# Patient Record
Sex: Female | Born: 2000 | Race: Black or African American | Hispanic: No | Marital: Single | Smoking: Former smoker
Health system: Southern US, Community
[De-identification: ages and names within clinical notes are randomized; demographics above are authoritative.]

## PROBLEM LIST (undated history)

## (undated) ENCOUNTER — Ambulatory Visit (HOSPITAL_COMMUNITY): Admission: EM | Payer: No Typology Code available for payment source | Source: Home / Self Care

## (undated) DIAGNOSIS — N390 Urinary tract infection, site not specified: Secondary | ICD-10-CM

## (undated) DIAGNOSIS — K2970 Gastritis, unspecified, without bleeding: Secondary | ICD-10-CM

## (undated) DIAGNOSIS — F32A Depression, unspecified: Secondary | ICD-10-CM

## (undated) HISTORY — PX: NO PAST SURGERIES: SHX2092

## (undated) HISTORY — PX: WISDOM TOOTH EXTRACTION: SHX21

---
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
## 2022-02-08 ENCOUNTER — Other Ambulatory Visit: Payer: Self-pay | Admitting: Emergency Medicine

## 2022-02-08 ENCOUNTER — Telehealth: Payer: Self-pay | Admitting: Emergency Medicine

## 2022-02-08 DIAGNOSIS — B379 Candidiasis, unspecified: Secondary | ICD-10-CM

## 2022-02-08 MED ORDER — FLUCONAZOLE 150 MG PO TABS: 150.0000 mg | ORAL_TABLET | Freq: Once | ORAL | 0 refills | Status: AC

## 2022-02-08 NOTE — Telephone Encounter (Signed)
Patient called requesting rx for diflucan due to yeast infection symptoms.  Rx sent to pharmacy on file per protocol.   Phone call to patient to inform of prescription being sent to pharmacy, unable to leave message.

## 2022-02-09 ENCOUNTER — Other Ambulatory Visit: Payer: Self-pay

## 2022-02-09 ENCOUNTER — Ambulatory Visit (HOSPITAL_COMMUNITY)
Admission: EM | Admit: 2022-02-09 | Discharge: 2022-02-09 | Disposition: A | Discharge status: Home / Self Care | Payer: Medicaid Other | Attending: Internal Medicine | Admitting: Internal Medicine

## 2022-02-09 ENCOUNTER — Encounter (HOSPITAL_COMMUNITY): Payer: Self-pay | Admitting: Emergency Medicine

## 2022-02-09 DIAGNOSIS — N3000 Acute cystitis without hematuria: Secondary | ICD-10-CM | POA: Diagnosis not present

## 2022-02-09 LAB — POC URINE PREG, ED: Preg Test, Ur: NEGATIVE

## 2022-02-09 LAB — POCT URINALYSIS DIPSTICK, ED / UC
Bilirubin Urine: NEGATIVE
Glucose, UA: NEGATIVE mg/dL
Ketones, ur: 40 mg/dL — AB
Nitrite: POSITIVE — AB
Protein, ur: 30 mg/dL — AB
Specific Gravity, Urine: 1.025 (ref 1.005–1.030)
Urobilinogen, UA: 0.2 mg/dL (ref 0.0–1.0)
pH: 5.5 (ref 5.0–8.0)

## 2022-02-09 LAB — HIV ANTIBODY (ROUTINE TESTING W REFLEX): HIV Screen 4th Generation wRfx: NONREACTIVE

## 2022-02-09 MED ORDER — FLUCONAZOLE 150 MG PO TABS: 150.0000 mg | ORAL_TABLET | Freq: Every day | ORAL | 0 refills | Status: DC

## 2022-02-09 MED ORDER — PHENAZOPYRIDINE HCL 200 MG PO TABS: 200.0000 mg | ORAL_TABLET | Freq: Three times a day (TID) | ORAL | 0 refills | Status: DC

## 2022-02-09 MED ORDER — CEPHALEXIN 500 MG PO CAPS: 500.0000 mg | ORAL_CAPSULE | Freq: Two times a day (BID) | ORAL | 0 refills | Status: AC

## 2022-02-09 NOTE — ED Triage Notes (Signed)
Pt reports having urinary frequency and not able to go a lot when urinates. Reports discharge is thicker than normal.  Pt wants to be "tested for everything since I don't what is going on and havent been tested since 2021".

## 2022-02-09 NOTE — ED Provider Notes (Addendum)
[place]    CSN: [id number] Arrival date & time: 02/09/22  1733      History   Chief Complaint Chief Complaint  Patient presents with   Urinary Frequency    HPI Evelyn [name] is a 21 y.o. female. Pt reports having urinary frequency and not able to go a lot when urinates x 2.5 days. Reports vaginal discharge is thicker than normal. Sex partners are women only. Reports has a new partner and wants STI testing.    Urinary Frequency Associated symptoms include abdominal pain.   History reviewed. No pertinent past medical history.  Patient Active Problem List   Diagnosis Date Noted   ADHD (attention deficit hyperactivity disorder), combined type 07/08/2018   Oppositional defiant disorder 07/08/2018   Major depressive disorder, recurrent severe without psychotic features ([place]) 07/07/2018    History reviewed. No pertinent surgical history.  OB History     Gravida  0   Para  0   Term  0   Preterm  0   AB  0   Living  0      SAB  0   IAB  0   Ectopic  0   Multiple  0   Live Births  0            Home Medications    Prior to Admission medications   Medication Sig Start Date End Date Taking? Authorizing Provider  cephALEXin (KEFLEX) 500 MG capsule Take 1 capsule (500 mg total) by mouth 2 (two) times daily for 5 days. 02/09/22 02/14/22 Yes Carvel Getting, NP  fluconazole (DIFLUCAN) 150 MG tablet Take 1 tablet (150 mg total) by mouth daily. 02/09/22  Yes Carvel Getting, NP  phenazopyridine (PYRIDIUM) 200 MG tablet Take 1 tablet (200 mg total) by mouth 3 (three) times daily. 02/09/22  Yes Carvel Getting, NP  ibuprofen (ADVIL) 600 MG tablet Take 1 tablet (600 mg total) by mouth every 6 (six) hours as needed. 10/06/21   Lacretia Leigh, MD    Family History Family History  Problem Relation Age of Onset   Diabetes Mother    Alcohol abuse Father     Social History Social History   Tobacco Use   Smoking status: Former   Smokeless  tobacco: Never  Scientific laboratory technician Use: Never used  Substance Use Topics   Alcohol use: No   Drug use: Yes    Types: Marijuana    Comment: Reports last use 2 weeks ago     Allergies   Cats claw (uncaria tomentosa)   Review of Systems Review of Systems  Constitutional:  Positive for chills. Negative for fever.  Gastrointestinal:  Positive for abdominal pain. Negative for nausea and vomiting.  Genitourinary:  Positive for dysuria, frequency, urgency and vaginal discharge. Negative for flank pain.    Physical Exam Triage Vital Signs ED Triage Vitals [02/09/22 1800]  Enc Vitals Group     BP 117/79     Pulse Rate 83     Resp 16     Temp 98.4 F (36.9 C)     Temp Source Oral     SpO2 99 %     Weight      Height      Head Circumference      Peak Flow      Pain Score      Pain Loc      Pain Edu?      Excl. in [place]?  No data found.  Updated Vital Signs BP 117/79 (BP Location: Right Arm)    Pulse 83    Temp 98.4 F (36.9 C) (Oral)    Resp 16    LMP 01/31/2022    SpO2 99%   Visual Acuity Right Eye Distance:   Left Eye Distance:   Bilateral Distance:    Right Eye Near:   Left Eye Near:    Bilateral Near:     Physical Exam Constitutional:      Appearance: Normal appearance.  Cardiovascular:     Rate and Rhythm: Normal rate and regular rhythm.  Pulmonary:     Effort: Pulmonary effort is normal.     Breath sounds: Normal breath sounds.  Abdominal:     General: Abdomen is flat. Bowel sounds are normal.     Palpations: Abdomen is soft.     Tenderness: There is abdominal tenderness in the suprapubic area. There is no right CVA tenderness, left CVA tenderness, guarding or rebound.  Neurological:     Mental Status: She is alert.     UC Treatments / Results  Labs (all labs ordered are listed, but only abnormal results are displayed) Labs Reviewed  POCT URINALYSIS DIPSTICK, ED / UC - Abnormal; Notable for the following components:      Result Value    Ketones, ur 40 (*)    Hgb urine dipstick MODERATE (*)    Protein, ur 30 (*)    Nitrite POSITIVE (*)    Leukocytes,Ua MODERATE (*)    All other components within normal limits  RPR  HIV ANTIBODY (ROUTINE TESTING W REFLEX)  POC URINE PREG, ED  POCT URINALYSIS DIPSTICK, ED / UC  CERVICOVAGINAL ANCILLARY ONLY    EKG   Radiology No results found.  Procedures Procedures (including critical care time)  Medications Ordered in UC Medications - No data to display  Initial Impression / Assessment and Plan / UC Course  I have reviewed the triage vital signs and the nursing notes.  Pertinent labs & imaging results that were available during my care of the patient were reviewed by me and considered in my medical decision making (see chart for details).    Discussed STI risk. Discussed UTI prevention. NItrite positive on UA. Rx keflex and pyridium. Pt requests yeast infection rx because antibiotics give her yeast infection - rx diflucan.   Final Clinical Impressions(s) / UC Diagnoses   Final diagnoses:  Acute cystitis without hematuria     Discharge Instructions      Drink lots of liquids to stay hydrated and to help flush the bacteria from your bladder.    ED Prescriptions     Medication Sig Dispense Auth. Provider   cephALEXin (KEFLEX) 500 MG capsule Take 1 capsule (500 mg total) by mouth 2 (two) times daily for 5 days. 10 capsule Carvel Getting, NP   fluconazole (DIFLUCAN) 150 MG tablet Take 1 tablet (150 mg total) by mouth daily. 1 tablet Carvel Getting, NP   phenazopyridine (PYRIDIUM) 200 MG tablet Take 1 tablet (200 mg total) by mouth 3 (three) times daily. 6 tablet Carvel Getting, NP      PDMP not reviewed this encounter.   Carvel Getting, NP 02/09/22 1849    Carvel Getting, NP 02/10/22 [phone]

## 2022-02-09 NOTE — Discharge Instructions (Signed)
Drink lots of liquids to stay hydrated and to help flush the bacteria from your bladder.

## 2022-02-10 ENCOUNTER — Telehealth (HOSPITAL_COMMUNITY): Payer: Self-pay | Admitting: Emergency Medicine

## 2022-02-10 LAB — CERVICOVAGINAL ANCILLARY ONLY
Bacterial Vaginitis (gardnerella): POSITIVE — AB
Candida Glabrata: NEGATIVE
Candida Vaginitis: NEGATIVE
Chlamydia: NEGATIVE
Comment: NEGATIVE
Comment: NEGATIVE
Comment: NEGATIVE
Comment: NEGATIVE
Comment: NEGATIVE
Comment: NORMAL
Neisseria Gonorrhea: NEGATIVE
Trichomonas: NEGATIVE

## 2022-02-10 LAB — RPR: RPR Ser Ql: NONREACTIVE

## 2022-02-10 MED ORDER — METRONIDAZOLE 500 MG PO TABS: 500.0000 mg | ORAL_TABLET | Freq: Two times a day (BID) | ORAL | 0 refills | Status: DC

## 2022-02-10 MED ORDER — FLUCONAZOLE 150 MG PO TABS: 150.0000 mg | ORAL_TABLET | Freq: Once | ORAL | 0 refills | Status: AC

## 2022-05-27 ENCOUNTER — Ambulatory Visit (HOSPITAL_COMMUNITY)
Admission: EM | Admit: 2022-05-27 | Discharge: 2022-05-27 | Disposition: A | Discharge status: Home / Self Care | Payer: Medicaid Other

## 2022-05-27 ENCOUNTER — Encounter (HOSPITAL_COMMUNITY): Payer: Self-pay | Admitting: Emergency Medicine

## 2022-05-27 DIAGNOSIS — J0100 Acute maxillary sinusitis, unspecified: Secondary | ICD-10-CM

## 2022-05-27 DIAGNOSIS — R040 Epistaxis: Secondary | ICD-10-CM | POA: Diagnosis not present

## 2022-05-27 DIAGNOSIS — R0981 Nasal congestion: Secondary | ICD-10-CM

## 2022-05-27 MED ORDER — FLUTICASONE PROPIONATE 50 MCG/ACT NA SUSP: 2.0000 | Freq: Every day | NASAL | 0 refills | Status: DC

## 2022-05-27 MED ORDER — FLUCONAZOLE 150 MG PO TABS: 150.0000 mg | ORAL_TABLET | Freq: Every day | ORAL | 0 refills | Status: DC

## 2022-05-27 MED ORDER — AMOXICILLIN-POT CLAVULANATE 875-125 MG PO TABS: 1.0000 | ORAL_TABLET | Freq: Two times a day (BID) | ORAL | 0 refills | Status: DC

## 2022-05-27 NOTE — ED Triage Notes (Signed)
Patient c/o nose bleeding that started yesterday.   Patient endorses 5 episodes of nose bleeding yesterday. Bleeding is currently controlled.   Patient endorses having nasal congestion. Patient endorses having "cold symptoms" this past week. Patient endorses earlier this week she's had chills and dry throat that has resolved.   Patient endorses history of nose bleed in the past.   Patient hasn't taken any medications for nosebleed.   Patient has taken OTC Dayquil and Nyquil for cold symptoms.

## 2022-05-27 NOTE — Discharge Instructions (Addendum)
As discussed purchase over-the-counter Afrin to have on hand if your nosebleed starts again.  However suspect recent nosebleed was related to sinus infection therefore I am treating you with Augmentin twice daily for 7 days.  Also recommend use of daily Flonase to prevent nasal congestion related to outdoor allergens.  Hydrate well with fluids.. If nosebleeds persist I have included information to follow-up with ENT.

## 2022-05-27 NOTE — ED Provider Notes (Signed)
[facility]    CSN: [id number] Arrival date & time: 05/27/22  0803      History   Chief Complaint Chief Complaint  Patient presents with   Epistaxis    HPI Evelyn [name] is a 21 y.o. female.   HPI Patient presents today for evaluation of URI symptoms that she has had for over a week and recent recurrent nosebleed over the last 2 days. Patient reports having nasal congestion, runny nose, and cough early in the week and has been managing symptoms with over-the-counter medications.  She reports yesterday while being outside for period of time experiencing a total of 5 nosebleeds.  She reports nosebleeds were exacerbated by sneezing and blowing of her nose.  She has a distant history of nosebleeds but has not had any recent recurrent nosebleeds. She endorses remaining sinus congestion.  She is no longer coughing and does not have any type of a sore throat or fever.  History reviewed. No pertinent past medical history.  Patient Active Problem List   Diagnosis Date Noted   ADHD (attention deficit hyperactivity disorder), combined type 07/08/2018   Oppositional defiant disorder 07/08/2018   Major depressive disorder, recurrent severe without psychotic features ([facility]) 07/07/2018    History reviewed. No pertinent surgical history.  OB History     Gravida  0   Para  0   Term  0   Preterm  0   AB  0   Living  0      SAB  0   IAB  0   Ectopic  0   Multiple  0   Live Births  0            Home Medications    Prior to Admission medications   Medication Sig Start Date End Date Taking? Authorizing Provider  amoxicillin-clavulanate (AUGMENTIN) 875-125 MG tablet Take 1 tablet by mouth every 12 (twelve) hours. 05/27/22  Yes Bing Neighbors, FNP  fluconazole (DIFLUCAN) 150 MG tablet Take 1 tablet (150 mg total) by mouth daily. 05/27/22  Yes Bing Neighbors, FNP  fluticasone (FLONASE) 50 MCG/ACT nasal spray Place 2 sprays into both nostrils daily.  05/27/22  Yes Bing Neighbors, FNP  ibuprofen (ADVIL) 600 MG tablet Take 1 tablet (600 mg total) by mouth every 6 (six) hours as needed. 10/06/21  Yes Lorre Nick, MD  Pseudoeph-Doxylamine-DM-APAP (NYQUIL PO) Take by mouth.   Yes [provider]  Pseudoephedrine-APAP-DM (DAYQUIL PO) Take by mouth.   Yes [provider]  phenazopyridine (PYRIDIUM) 200 MG tablet Take 1 tablet (200 mg total) by mouth 3 (three) times daily. 02/09/22   Cathlyn Parsons, NP    Family History Family History  Problem Relation Age of Onset   Diabetes Mother    Alcohol abuse Father     Social History Social History   Tobacco Use   Smoking status: Former   Smokeless tobacco: Never  Building services engineer Use: Never used  Substance Use Topics   Alcohol use: No   Drug use: Yes    Types: Marijuana    Comment: Reports last use 2 weeks ago     Allergies   Cats claw (uncaria tomentosa)   Review of Systems Review of Systems Pertinent negatives listed in HPI   Physical Exam Triage Vital Signs ED Triage Vitals  Enc Vitals Group     BP      Pulse      Resp  Temp      Temp src      SpO2      Weight      Height      Head Circumference      Peak Flow      Pain Score      Pain Loc      Pain Edu?      Excl. in [place]?    No data found.  Updated Vital Signs BP 114/77 (BP Location: Left Arm)   Pulse 72   Temp 98 F (36.7 C) (Oral)   Resp 16   SpO2 100%   Visual Acuity Right Eye Distance:   Left Eye Distance:   Bilateral Distance:    Right Eye Near:   Left Eye Near:    Bilateral Near:     Physical Exam  General Appearance:    Alert, cooperative, no distress  HENT:   Normocephalic, ears normal, nares mucosal edema with congestion, rhinorrhea, oropharynx  w/o erythema or exudate   Eyes:    PERRL, conjunctiva/corneas clear, EOM's intact       Lungs:     Clear to auscultation bilaterally, respirations unlabored  Heart:    Regular rate and rhythm  Neurologic:   Awake,  alert, oriented x 3. No apparent focal neurological           defect.      UC Treatments / Results  Labs (all labs ordered are listed, but only abnormal results are displayed) Labs Reviewed - No data to display  EKG   Radiology No results found.  Procedures Procedures (including critical care time)  Medications Ordered in UC Medications - No data to display  Initial Impression / Assessment and Plan / UC Course  I have reviewed the triage vital signs and the nursing notes.  Pertinent labs & imaging results that were available during my care of the patient were reviewed by me and considered in my medical decision making (see chart for details).    Acute nonrecurrent maxillary sinusitis treatment today with Augmentin twice daily for 7 days. Epistaxis stable at present recommend use of as needed Afrin.  If becomes recurrent to follow-up with ENT. For nasal congestion recommend daily use of Flonase to prevent symptoms related to outdoor allergens. Return as needed. Final Clinical Impressions(s) / UC Diagnoses   Final diagnoses:  Acute non-recurrent maxillary sinusitis  Epistaxis  Nasal congestion     Discharge Instructions      As discussed purchase over-the-counter Afrin to have on hand if your nosebleed starts again.  However suspect recent nosebleed was related to sinus infection therefore I am treating you with Augmentin twice daily for 7 days.  Also recommend use of daily Flonase to prevent nasal congestion related to outdoor allergens.  Hydrate well with fluids.. If nosebleeds persist I have included information to follow-up with ENT.     ED Prescriptions     Medication Sig Dispense Auth. Provider   fluticasone (FLONASE) 50 MCG/ACT nasal spray Place 2 sprays into both nostrils daily. 16 g Bing Neighbors, FNP   amoxicillin-clavulanate (AUGMENTIN) 875-125 MG tablet Take 1 tablet by mouth every 12 (twelve) hours. 14 tablet Bing Neighbors, FNP   fluconazole  (DIFLUCAN) 150 MG tablet Take 1 tablet (150 mg total) by mouth daily. 2 tablet Bing Neighbors, FNP      PDMP not reviewed this encounter.   Bing Neighbors, [place] 05/27/22 [phone]

## 2022-09-11 ENCOUNTER — Encounter (HOSPITAL_COMMUNITY): Payer: Self-pay | Admitting: Emergency Medicine

## 2022-09-11 ENCOUNTER — Ambulatory Visit (HOSPITAL_COMMUNITY)
Admission: EM | Admit: 2022-09-11 | Discharge: 2022-09-11 | Disposition: A | Discharge status: Home / Self Care | Payer: Medicaid Other | Attending: Emergency Medicine | Admitting: Emergency Medicine

## 2022-09-11 DIAGNOSIS — N898 Other specified noninflammatory disorders of vagina: Secondary | ICD-10-CM | POA: Diagnosis present

## 2022-09-11 LAB — POCT URINALYSIS DIPSTICK, ED / UC
Bilirubin Urine: NEGATIVE
Glucose, UA: NEGATIVE mg/dL
Hgb urine dipstick: NEGATIVE
Ketones, ur: NEGATIVE mg/dL
Leukocytes,Ua: NEGATIVE
Nitrite: NEGATIVE
Protein, ur: NEGATIVE mg/dL
Specific Gravity, Urine: 1.02 (ref 1.005–1.030)
Urobilinogen, UA: 1 mg/dL (ref 0.0–1.0)
pH: 7 (ref 5.0–8.0)

## 2022-09-11 MED ORDER — METRONIDAZOLE 500 MG PO TABS: 500.0000 mg | ORAL_TABLET | Freq: Two times a day (BID) | ORAL | 0 refills | Status: DC

## 2022-09-11 MED ORDER — NYSTATIN 100000 UNIT/GM EX CREA: TOPICAL_CREAM | CUTANEOUS | 0 refills | Status: DC

## 2022-09-11 MED ORDER — FLUCONAZOLE 150 MG PO TABS: 150.0000 mg | ORAL_TABLET | Freq: Every day | ORAL | 0 refills | Status: DC

## 2022-09-11 NOTE — ED Notes (Signed)
Pt also adds that concerned for a UTI and would like that tested today.

## 2022-09-11 NOTE — ED Provider Notes (Signed)
[facility]    CSN: [id number] Arrival date & time: 09/11/22  1025      History   Chief Complaint Chief Complaint  Patient presents with   vaginal odor    HPI Evelyn [name] is a 21 y.o. female.   Patient presents with vaginal odor beginning today.  Endorses that she stopped menses 1 day ago, during menses she had vaginal itching and irritation that cleared with use of nystatin.  Endorses urinary frequency without increase of fluid intake.  Sexually active, female partner, no known exposure.  Denies urgency, dysuria, hematuria, abdominal pain or pressure, flank pain, fever, chills, new rash or lesions.     History reviewed. No pertinent past medical history.  Patient Active Problem List   Diagnosis Date Noted   ADHD (attention deficit hyperactivity disorder), combined type 07/08/2018   Oppositional defiant disorder 07/08/2018   Major depressive disorder, recurrent severe without psychotic features ([facility]) 07/07/2018    History reviewed. No pertinent surgical history.  OB History     Gravida  0   Para  0   Term  0   Preterm  0   AB  0   Living  0      SAB  0   IAB  0   Ectopic  0   Multiple  0   Live Births  0            Home Medications    Prior to Admission medications   Medication Sig Start Date End Date Taking? Authorizing Provider  amoxicillin-clavulanate (AUGMENTIN) 875-125 MG tablet Take 1 tablet by mouth every 12 (twelve) hours. 05/27/22   Bing Neighbors, FNP  fluconazole (DIFLUCAN) 150 MG tablet Take 1 tablet (150 mg total) by mouth daily. 05/27/22   Bing Neighbors, FNP  fluticasone (FLONASE) 50 MCG/ACT nasal spray Place 2 sprays into both nostrils daily. 05/27/22   Bing Neighbors, FNP  ibuprofen (ADVIL) 600 MG tablet Take 1 tablet (600 mg total) by mouth every 6 (six) hours as needed. 10/06/21   Lorre Nick, MD  phenazopyridine (PYRIDIUM) 200 MG tablet Take 1 tablet (200 mg total) by mouth 3 (three) times daily.  02/09/22   Cathlyn Parsons, NP  Pseudoeph-Doxylamine-DM-APAP (NYQUIL PO) Take by mouth.    [provider]  Pseudoephedrine-APAP-DM (DAYQUIL PO) Take by mouth.    [provider]    Family History Family History  Problem Relation Age of Onset   Diabetes Mother    Alcohol abuse Father     Social History Social History   Tobacco Use   Smoking status: Former   Smokeless tobacco: Never  Building services engineer Use: Never used  Substance Use Topics   Alcohol use: No   Drug use: Yes    Types: Marijuana    Comment: Reports last use 2 weeks ago     Allergies   Cats claw (uncaria tomentosa)   Review of Systems Review of Systems  Constitutional: Negative.   Respiratory: Negative.    Cardiovascular: Negative.   Genitourinary:  Positive for frequency. Negative for decreased urine volume, difficulty urinating, dyspareunia, dysuria, enuresis, flank pain, genital sores, hematuria, menstrual problem, pelvic pain, urgency, vaginal bleeding, vaginal discharge and vaginal pain.  Skin: Negative.   Neurological: Negative.      Physical Exam Triage Vital Signs ED Triage Vitals  Enc Vitals Group     BP 09/11/22 1050 111/77     Pulse Rate 09/11/22 1050 83  Resp 09/11/22 1050 14     Temp 09/11/22 1050 99.1 F (37.3 C)     Temp Source 09/11/22 1050 Oral     SpO2 09/11/22 1050 98 %     Weight --      Height --      Head Circumference --      Peak Flow --      Pain Score 09/11/22 1049 0     Pain Loc --      Pain Edu? --      Excl. in [place]? --    No data found.  Updated Vital Signs BP 111/77 (BP Location: Right Arm)   Pulse 83   Temp 99.1 F (37.3 C) (Oral)   Resp 14   LMP 09/05/2022   SpO2 98%   Visual Acuity Right Eye Distance:   Left Eye Distance:   Bilateral Distance:    Right Eye Near:   Left Eye Near:    Bilateral Near:     Physical Exam Constitutional:      Appearance: Normal appearance.  HENT:     Head: Normocephalic.  Eyes:      Extraocular Movements: Extraocular movements intact.  Pulmonary:     Effort: Pulmonary effort is normal.  Genitourinary:    Comments: Deferred Skin:    General: Skin is warm and dry.  Neurological:     Mental Status: She is alert and oriented to person, place, and time. Mental status is at baseline.  Psychiatric:        Mood and Affect: Mood normal.        Behavior: Behavior normal.      UC Treatments / Results  Labs (all labs ordered are listed, but only abnormal results are displayed) Labs Reviewed  POCT URINALYSIS DIPSTICK, ED / UC    EKG   Radiology No results found.  Procedures Procedures (including critical care time)  Medications Ordered in UC Medications - No data to display  Initial Impression / Assessment and Plan / UC Course  I have reviewed the triage vital signs and the nursing notes.  Pertinent labs & imaging results that were available during my care of the patient were reviewed by me and considered in my medical decision making (see chart for details).  Vaginal odor  Urinalysis negative, will prophylactically treat for metronidazole, endorses yeast infections after use of antibiotics, Diflucan prescribed in addition, STI labs pending will treat further per protocol, advised abstinence until lab results, and/or treatment is complete, advised condom use during all sexual encounters moving, may follow-up with urgent care as needed  Final Clinical Impressions(s) / UC Diagnoses   Final diagnoses:  None   Discharge Instructions   None    ED Prescriptions   None    PDMP not reviewed this encounter.   Hans Eden, NP 09/11/22 [phone]

## 2022-09-11 NOTE — Discharge Instructions (Addendum)
Today you are being treated prophylactically for  Bacterial vaginosis   Take Metronidazole 500 mg twice a day for 7 days, do not drink alcohol while using medication, this will make you feel sick, may use diflucan as needed  Bacterial vaginosis which results from an overgrowth of one on several organisms that are normally present in your vagina. Vaginosis is an inflammation of the vagina that can result in discharge, itching and pain.  Labs pending 2-3 days, you will be contacted if positive for any sti and treatment will be sent to the pharmacy, you will have to return to the clinic if positive for gonorrhea to receive treatment   Please refrain from having sex until labs results, if positive please refrain from having sex until treatment complete and symptoms resolve   If positive for , Chlamydia  gonorrhea or trichomoniasis please notify partner or partners so they may tested as well  Moving forward, it is recommended you use some form of protection against the transmission of sti infections  such as condoms or dental dams with each sexual encounter     In addition: Avoid baths, hot tubs and whirlpool spas.  Don't use scented or harsh soaps Avoid irritants. These include scented tampons and pads. Wipe from front to back after using the toilet. Don't douche. Your vagina doesn't require cleansing other than normal bathing.  Use a condom.  Wear cotton underwear, this fabric absorbs some moisture.

## 2022-09-11 NOTE — ED Triage Notes (Signed)
Pt reports coming off her menstrual cycle. Reports foul odor this morning. Reports her vaginal discharge is normal for her.  Had sexual intercourse with her girlfriend week-week and half ago. Unsure if might be BV because had before.

## 2022-09-12 LAB — CERVICOVAGINAL ANCILLARY ONLY
Bacterial Vaginitis (gardnerella): POSITIVE — AB
Candida Glabrata: NEGATIVE
Candida Vaginitis: NEGATIVE
Chlamydia: NEGATIVE
Comment: NEGATIVE
Comment: NEGATIVE
Comment: NEGATIVE
Comment: NEGATIVE
Comment: NEGATIVE
Comment: NORMAL
Neisseria Gonorrhea: NEGATIVE
Trichomonas: NEGATIVE

## 2022-11-02 ENCOUNTER — Encounter (HOSPITAL_COMMUNITY): Payer: Self-pay | Admitting: Emergency Medicine

## 2022-11-02 ENCOUNTER — Ambulatory Visit (HOSPITAL_COMMUNITY)
Admission: EM | Admit: 2022-11-02 | Discharge: 2022-11-02 | Disposition: A | Discharge status: Home / Self Care | Payer: Medicaid Other | Attending: Physician Assistant | Admitting: Physician Assistant

## 2022-11-02 ENCOUNTER — Other Ambulatory Visit: Payer: Self-pay

## 2022-11-02 DIAGNOSIS — N898 Other specified noninflammatory disorders of vagina: Secondary | ICD-10-CM | POA: Diagnosis not present

## 2022-11-02 MED ORDER — CLINDAMYCIN HCL 300 MG PO CAPS: 300.0000 mg | ORAL_CAPSULE | Freq: Two times a day (BID) | ORAL | 0 refills | Status: AC

## 2022-11-02 MED ORDER — BORIC ACID VAGINAL 600 MG VA SUPP: 600.0000 mg | Freq: Every day | VAGINAL | 1 refills | Status: DC

## 2022-11-02 NOTE — ED Provider Notes (Signed)
[facility]    CSN: [id number] Arrival date & time: 11/02/22  1456      History   Chief Complaint Chief Complaint  Patient presents with   vaginal odor    HPI Evelyn [name] is a 21 y.o. female.   Patient reports a several day history of vaginal odor and increased discharge.  She has a history of recurrent BV and states current symptoms are similar to previous episodes of this condition.  She was last seen September 11, 2022 at which point she had BV and was treated with metronidazole.  Reports she is having difficulty tolerating this medication as it causes drowsiness as well as decreased appetite.  She is requesting a different medication if appropriate.  Reports that she generally gets this after her menstrual cycle which just ended within the past few days.  She is confident she is not pregnant she has not been sexually active recently.  She has no concern for STI and would prefer to avoid testing (due to expense) if possible.  She has not taken boric acid suppositories in the past but is open to trying this.  Does not currently see OB/GYN.    History reviewed. No pertinent past medical history.  Patient Active Problem List   Diagnosis Date Noted   ADHD (attention deficit hyperactivity disorder), combined type 07/08/2018   Oppositional defiant disorder 07/08/2018   Major depressive disorder, recurrent severe without psychotic features (HCC) 07/07/2018    History reviewed. No pertinent surgical history.  OB History     Gravida  0   Para  0   Term  0   Preterm  0   AB  0   Living  0      SAB  0   IAB  0   Ectopic  0   Multiple  0   Live Births  0            Home Medications    Prior to Admission medications   Medication Sig Start Date End Date Taking? Authorizing Provider  Boric Acid Vaginal 600 MG SUPP Place 600 mg vaginally at bedtime. 11/02/22  Yes Emmanuel Gruenhagen K, PA-C  clindamycin (CLEOCIN) 300 MG capsule Take 1 capsule (300 mg  total) by mouth in the morning and at bedtime for 7 days. 11/02/22 11/09/22 Yes Delton Stelle K, PA-C  ibuprofen (ADVIL) 600 MG tablet Take 1 tablet (600 mg total) by mouth every 6 (six) hours as needed. 10/06/21   Lorre Nick, MD  nystatin cream (MYCOSTATIN) Apply to affected area 2 times daily Patient not taking: Reported on 11/02/2022 09/11/22   Valinda Hoar, NP    Family History Family History  Problem Relation Age of Onset   Diabetes Mother    Alcohol abuse Father     Social History Social History   Tobacco Use   Smoking status: Former   Smokeless tobacco: Never  Building services engineer Use: Some days  Substance Use Topics   Alcohol use: Yes   Drug use: Yes    Types: Marijuana    Comment: Reports last use 2 weeks ago     Allergies   Cats claw (uncaria tomentosa)   Review of Systems Review of Systems  Constitutional:  Negative for activity change, appetite change, fatigue and fever.  Gastrointestinal:  Negative for abdominal pain, diarrhea, nausea and vomiting.  Genitourinary:  Positive for vaginal discharge. Negative for dysuria, frequency, pelvic pain, urgency, vaginal bleeding and vaginal pain.  Physical Exam Triage Vital Signs ED Triage Vitals  Enc Vitals Group     BP 11/02/22 1522 122/79     Pulse Rate 11/02/22 1522 92     Resp 11/02/22 1522 18     Temp 11/02/22 1522 98.1 F (36.7 C)     Temp Source 11/02/22 1522 Oral     SpO2 11/02/22 1522 99 %     Weight --      Height --      Head Circumference --      Peak Flow --      Pain Score 11/02/22 1519 0     Pain Loc --      Pain Edu? --      Excl. in [place]? --    No data found.  Updated Vital Signs BP 122/79 (BP Location: Right Arm) Comment (BP Location): small adult cuff  Pulse 92   Temp 98.1 F (36.7 C) (Oral)   Resp 18   SpO2 99%   Visual Acuity Right Eye Distance:   Left Eye Distance:   Bilateral Distance:    Right Eye Near:   Left Eye Near:    Bilateral Near:     Physical  Exam Vitals reviewed.  Constitutional:      General: She is awake. She is not in acute distress.    Appearance: Normal appearance. She is well-developed. She is not ill-appearing.     Comments: Very pleasant female appears stated age in no acute distress sitting comfortably in exam room  HENT:     Head: Normocephalic and atraumatic.  Cardiovascular:     Rate and Rhythm: Normal rate and regular rhythm.     Heart sounds: Normal heart sounds, S1 normal and S2 normal. No murmur heard. Pulmonary:     Effort: Pulmonary effort is normal.     Breath sounds: Normal breath sounds. No wheezing, rhonchi or rales.     Comments: Clear to auscultation bilaterally Abdominal:     General: Bowel sounds are normal.     Palpations: Abdomen is soft.     Tenderness: There is no abdominal tenderness. There is no right CVA tenderness, left CVA tenderness, guarding or rebound.     Comments: Benign abdominal exam  Psychiatric:        Behavior: Behavior is cooperative.      UC Treatments / Results  Labs (all labs ordered are listed, but only abnormal results are displayed) Labs Reviewed - No data to display  EKG   Radiology No results found.  Procedures Procedures (including critical care time)  Medications Ordered in UC Medications - No data to display  Initial Impression / Assessment and Plan / UC Course  I have reviewed the triage vital signs and the nursing notes.  Pertinent labs & imaging results that were available during my care of the patient were reviewed by me and considered in my medical decision making (see chart for details).     Strongly suspect bacterial vaginosis given clinical presentation.  Patient declined STI swab today she is confident this is the cause of her symptoms and has not been sexually active.  We will treat with clindamycin as she has had difficulty tolerating metronidazole.  She was also encouraged to start boric acid suppositories and these were sent to the  pharmacy.  Recommended loosefitting cotton underwear and hypoallergenic soaps and detergents.  Discussed that if she has any worsening or persistent symptoms she needs to be reevaluated to which she expressed understanding.  Final  Clinical Impressions(s) / UC Diagnoses   Final diagnoses:  Vaginal odor  Vaginal discharge     Discharge Instructions      Start clindamycin twice daily for 7 days.  Use boric acid suppositories at night.  Make sure you are wearing loosefitting cotton underwear and use hypoallergenic soaps and detergents.  If you have any worsening or changing symptoms you need to be seen immediately.     ED Prescriptions     Medication Sig Dispense Auth. Provider   Boric Acid Vaginal 600 MG SUPP Place 600 mg vaginally at bedtime. 30 suppository Jayshun Galentine K, PA-C   clindamycin (CLEOCIN) 300 MG capsule Take 1 capsule (300 mg total) by mouth in the morning and at bedtime for 7 days. 14 capsule Brealyn Baril K, PA-C      PDMP not reviewed this encounter.   Jeani Hawking, PA-C 11/02/22 1603

## 2022-11-02 NOTE — Discharge Instructions (Signed)
Start clindamycin twice daily for 7 days.  Use boric acid suppositories at night.  Make sure you are wearing loosefitting cotton underwear and use hypoallergenic soaps and detergents.  If you have any worsening or changing symptoms you need to be seen immediately.

## 2022-11-02 NOTE — ED Triage Notes (Signed)
Patient is concerned for a vaginal odor.  2 months ago noted same odor.  Was seen at [facility] and diagnosed with BV.

## 2022-12-30 ENCOUNTER — Ambulatory Visit
Admission: RE | Admit: 2022-12-30 | Discharge: 2022-12-30 | Disposition: A | Discharge status: Home / Self Care | Payer: Medicaid Other | Source: Ambulatory Visit | Attending: Family Medicine | Admitting: Family Medicine

## 2022-12-30 VITALS — BP 108/79 | HR 98 | Temp 98.6°F | Resp 16 | Ht 62.5 in

## 2022-12-30 DIAGNOSIS — R1030 Lower abdominal pain, unspecified: Secondary | ICD-10-CM

## 2022-12-30 LAB — POCT URINALYSIS DIP (MANUAL ENTRY)
Bilirubin, UA: NEGATIVE
Blood, UA: NEGATIVE
Glucose, UA: NEGATIVE mg/dL
Ketones, POC UA: NEGATIVE mg/dL
Leukocytes, UA: NEGATIVE
Nitrite, UA: NEGATIVE
Protein Ur, POC: NEGATIVE mg/dL
Spec Grav, UA: 1.025 (ref 1.010–1.025)
Urobilinogen, UA: 0.2 E.U./dL
pH, UA: 6.5 (ref 5.0–8.0)

## 2022-12-30 LAB — POCT URINE PREGNANCY: Preg Test, Ur: NEGATIVE

## 2022-12-30 NOTE — ED Triage Notes (Signed)
Abdominal pain and cramping x 1 week  Denies pregnancy  Pt has had some intermittent diarrhea since  Menses ended 12/19/22 Equate tylenol for pain - helps with pain

## 2022-12-30 NOTE — Discharge Instructions (Signed)
For now, continue Equate Maximum Strength Menstrual Caplets as needed while waiting for GYN appointment.  If symptoms become significantly worse during the night or over the weekend, proceed to the local emergency room.

## 2022-12-30 NOTE — ED Provider Notes (Signed)
[name]    CSN: [id number] Arrival date & time: 12/30/22  1145      History   Chief Complaint Chief Complaint  Patient presents with  . Abdominal Pain    HPI Evelyn [name] is a 22 y.o. female.    Abdominal Pain  History reviewed. No pertinent past medical history.  Patient Active Problem List   Diagnosis Date Noted  . ADHD (attention deficit hyperactivity disorder), combined type 07/08/2018  . Oppositional defiant disorder 07/08/2018  . Major depressive disorder, recurrent severe without psychotic features ([place]) 07/07/2018    History reviewed. No pertinent surgical history.  OB History     Gravida  0   Para  0   Term  0   Preterm  0   AB  0   Living  0      SAB  0   IAB  0   Ectopic  0   Multiple  0   Live Births  0            Home Medications    Prior to Admission medications   Medication Sig Start Date End Date Taking? Authorizing Provider  Boric Acid Vaginal 600 MG SUPP Place 600 mg vaginally at bedtime. Patient not taking: Reported on 12/30/2022 11/02/22   Raspet, Derry Skill, PA-C  ibuprofen (ADVIL) 600 MG tablet Take 1 tablet (600 mg total) by mouth every 6 (six) hours as needed. Patient not taking: Reported on 12/30/2022 10/06/21   Lacretia Leigh, MD  nystatin cream (MYCOSTATIN) Apply to affected area 2 times daily Patient not taking: Reported on 11/02/2022 09/11/22   Hans Eden, NP    Family History Family History  Problem Relation Age of Onset  . Diabetes Mother   . Alcohol abuse Father     Social History Social History   Tobacco Use  . Smoking status: Every Day    Types: E-cigarettes  . Smokeless tobacco: Never  Vaping Use  . Vaping Use: Every day  . Substances: Nicotine, Flavoring  Substance Use Topics  . Alcohol use: Not Currently  . Drug use: Yes    Frequency: 4.0 times per week    Types: Marijuana     Allergies   Cats claw (uncaria tomentosa)   Review of Systems Review of Systems   Gastrointestinal:  Positive for abdominal pain.    Physical Exam Triage Vital Signs ED Triage Vitals  Enc Vitals Group     BP 12/30/22 1237 108/79     Pulse Rate 12/30/22 1237 98     Resp 12/30/22 1237 16     Temp 12/30/22 1237 98.6 F (37 C)     Temp Source 12/30/22 1237 Oral     SpO2 12/30/22 1237 100 %     Weight --      Height 12/30/22 1239 5' 2.5" (1.588 m)     Head Circumference --      Peak Flow --      Pain Score 12/30/22 1239 6     Pain Loc --      Pain Edu? --      Excl. in [place]? --    No data found.  Updated Vital Signs BP 108/79 (BP Location: Right Arm)   Pulse 98   Temp 98.6 F (37 C) (Oral)   Resp 16   Ht 5' 2.5" (1.588 m)   LMP 12/15/2022 (Exact Date)   SpO2 100%   BMI 20.52 kg/m   Visual Acuity Right Eye  Distance:   Left Eye Distance:   Bilateral Distance:    Right Eye Near:   Left Eye Near:    Bilateral Near:     Physical Exam Abdominal:      UC Treatments / Results  Labs (all labs ordered are listed, but only abnormal results are displayed) Labs Reviewed  POCT URINALYSIS DIP (MANUAL ENTRY)  POCT URINE PREGNANCY    EKG   Radiology No results found.  Procedures Procedures (including critical care time)  Medications Ordered in UC Medications - No data to display  Initial Impression / Assessment and Plan / UC Course  I have reviewed the triage vital signs and the nursing notes.  Pertinent labs & imaging results that were available during my care of the patient were reviewed by me and considered in my medical decision making (see chart for details).    Abdominal/pelvic pain ?etiology. Recommend evaluation by GYN (needs pelvic U/S).  Final Clinical Impressions(s) / UC Diagnoses   Final diagnoses:  Lower abdominal pain     Discharge Instructions      For now, continue Equate Maximum Strength Menstrual Caplets as needed while waiting for GYN appointment.  If symptoms become significantly worse during the night or  over the weekend, proceed to the local emergency room.     ED Prescriptions   None    PDMP not reviewed this encounter.

## 2023-01-10 ENCOUNTER — Ambulatory Visit: Payer: Medicaid Other

## 2023-01-10 ENCOUNTER — Ambulatory Visit
Admission: RE | Admit: 2023-01-10 | Discharge: 2023-01-10 | Disposition: A | Discharge status: Home / Self Care | Payer: Medicaid Other | Source: Ambulatory Visit | Attending: Emergency Medicine | Admitting: Emergency Medicine

## 2023-01-10 VITALS — BP 120/79 | HR 93 | Temp 97.9°F | Resp 16

## 2023-01-10 DIAGNOSIS — N946 Dysmenorrhea, unspecified: Secondary | ICD-10-CM

## 2023-01-10 LAB — POCT URINE PREGNANCY: Preg Test, Ur: NEGATIVE

## 2023-01-10 LAB — POCT URINALYSIS DIP (MANUAL ENTRY)
Bilirubin, UA: NEGATIVE
Glucose, UA: NEGATIVE mg/dL
Ketones, POC UA: NEGATIVE mg/dL
Leukocytes, UA: NEGATIVE
Nitrite, UA: NEGATIVE
Protein Ur, POC: 100 mg/dL — AB
Spec Grav, UA: >=1.03 — AB (ref 1.010–1.025)
Urobilinogen, UA: 0.2 E.U./dL
pH, UA: 6.5 (ref 5.0–8.0)

## 2023-01-10 MED ORDER — NAPROXEN 500 MG PO TABS: ORAL_TABLET | ORAL | 2 refills | Status: DC

## 2023-01-10 NOTE — ED Provider Notes (Signed)
[name]    CSN: [id number] Arrival date & time: 01/10/23  1441    HISTORY   Chief Complaint  Patient presents with   Nausea    Having bad nausea, menstrual cramps are a 10/10 pain wise.. medication i use is not relieving the pain. - Entered by patient   HPI Evelyn [name] is a pleasant, 22 y.o. female who presents to urgent care today. Pt states abdominal cramping and nausea since yesterday.  States this happens every month with her period, has been taking OTC medication with no relief.   The history is provided by the patient.   History reviewed. No pertinent past medical history. Patient Active Problem List   Diagnosis Date Noted   ADHD (attention deficit hyperactivity disorder), combined type 07/08/2018   Oppositional defiant disorder 07/08/2018   Major depressive disorder, recurrent severe without psychotic features (HCC) 07/07/2018   History reviewed. No pertinent surgical history. OB History     Gravida  0   Para  0   Term  0   Preterm  0   AB  0   Living  0      SAB  0   IAB  0   Ectopic  0   Multiple  0   Live Births  0          Home Medications    Prior to Admission medications   Medication Sig Start Date End Date Taking? Authorizing Provider  Boric Acid Vaginal 600 MG SUPP Place 600 mg vaginally at bedtime. Patient not taking: Reported on 12/30/2022 11/02/22   Raspet, Noberto Retort, PA-C  ibuprofen (ADVIL) 600 MG tablet Take 1 tablet (600 mg total) by mouth every 6 (six) hours as needed. Patient not taking: Reported on 12/30/2022 10/06/21   Lorre Nick, MD  nystatin cream (MYCOSTATIN) Apply to affected area 2 times daily Patient not taking: Reported on 11/02/2022 09/11/22   Valinda Hoar, NP    Family History Family History  Problem Relation Age of Onset   Diabetes Mother    Alcohol abuse Father    Social History Social History   Tobacco Use   Smoking status: Every Day    Types: E-cigarettes   Smokeless tobacco:  Never  Vaping Use   Vaping Use: Every day   Substances: Nicotine, Flavoring  Substance Use Topics   Alcohol use: Not Currently   Drug use: Yes    Frequency: 4.0 times per week    Types: Marijuana   Allergies   Cats claw (uncaria tomentosa)  Review of Systems Review of Systems Pertinent findings revealed after performing a 14 point review of systems has been noted in the history of present illness.  Physical Exam Triage Vital Signs ED Triage Vitals  Enc Vitals Group     BP 10/22/21 0827 (!) 147/82     Pulse Rate 10/22/21 0827 72     Resp 10/22/21 0827 18     Temp 10/22/21 0827 98.3 F (36.8 C)     Temp Source 10/22/21 0827 Oral     SpO2 10/22/21 0827 98 %     Weight --      Height --      Head Circumference --      Peak Flow --      Pain Score 10/22/21 0826 5     Pain Loc --      Pain Edu? --      Excl. in [place]? --   No data found.  Updated Vital Signs BP 120/79 (BP Location: Right Arm)   Pulse 93   Temp 97.9 F (36.6 C) (Oral)   Resp 16   LMP 01/09/2023 (Exact Date)   SpO2 99%   Physical Exam  Visual Acuity Right Eye Distance:   Left Eye Distance:   Bilateral Distance:    Right Eye Near:   Left Eye Near:    Bilateral Near:     [name] Couse / Diagnostics / Procedures:     Radiology No results found.  Procedures Procedures (including critical care time) EKG  Pending results:  Labs Reviewed  POCT URINALYSIS DIP (MANUAL ENTRY) - Abnormal; Notable for the following components:      Result Value   Spec Grav, UA >=1.030 (*)    Blood, UA moderate (*)    Protein Ur, POC =100 (*)    All other components within normal limits  POCT URINE PREGNANCY    Medications Ordered in [name]: Medications - No data to display  [name] Diagnoses / Final Clinical Impressions(s)   I have reviewed the triage vital signs and the nursing notes.  Pertinent labs & imaging results that were available during my care of the patient were reviewed by me and considered in my medical  decision making (see chart for details).    Final diagnoses:  Dysmenorrhea   Patient advised to begin naproxen 500 mg twice daily a few days before she anticipates the beginning of her period.  Patient advised to follow-up with gynecology.  Please see discharge instructions below for details of plan of care as provided to patient. ED Prescriptions     Medication Sig Dispense Auth. Provider   naproxen (NAPROSYN) 500 MG tablet Two days before you anticipate your period, begin taking 500 mg twice daily and continue until the end of your period. 60 tablet Lynden Oxford Scales, PA-C      PDMP not reviewed this encounter.  Pending results:  Labs Reviewed  POCT URINALYSIS DIP (MANUAL ENTRY) - Abnormal; Notable for the following components:      Result Value   Spec Grav, UA >=1.030 (*)    Blood, UA moderate (*)    Protein Ur, POC =100 (*)    All other components within normal limits  POCT URINE PREGNANCY    Discharge Instructions:   Discharge Instructions      Two days before you anticipate the beginning of your period, please begin taking naproxen 500 mg twice daily and continue taking naproxen until the end of your period.    Please follow up with Dr. Dorien Chihuahua at [facility] at the [facility] for further evaluation and treatment.   Thank you for visiting urgent care today.       Disposition Upon Discharge:  Condition: stable for discharge home  Patient presented with an acute illness with associated systemic symptoms and significant discomfort requiring urgent management. In my opinion, this is a condition that a prudent lay person (someone who possesses an average knowledge of health and medicine) may potentially expect to result in complications if not addressed urgently such as respiratory distress, impairment of bodily function or dysfunction of bodily organs.   Routine symptom specific, illness specific and/or disease  specific instructions were discussed with the patient and/or caregiver at length.   As such, the patient has been evaluated and assessed, work-up was performed and treatment was provided in alignment with urgent care protocols and evidence based medicine.  Patient/parent/caregiver has been advised that the  patient may require follow up for further testing and treatment if the symptoms continue in spite of treatment, as clinically indicated and appropriate.  Patient/parent/caregiver has been advised to return to the [facility] or PCP if no better; to PCP or the Emergency Department if new signs and symptoms develop, or if the current signs or symptoms continue to change or worsen for further workup, evaluation and treatment as clinically indicated and appropriate  The patient will follow up with their current PCP if and as advised. If the patient does not currently have a PCP we will assist them in obtaining one.   The patient may need specialty follow up if the symptoms continue, in spite of conservative treatment and management, for further workup, evaluation, consultation and treatment as clinically indicated and appropriate.  Patient/parent/caregiver verbalized understanding and agreement of plan as discussed.  All questions were addressed during visit.  Please see discharge instructions below for further details of plan.  This office note has been dictated using Museum/gallery curator.  Unfortunately, this method of dictation can sometimes lead to typographical or grammatical errors.  I apologize for your inconvenience in advance if this occurs.  Please do not hesitate to reach out to me if clarification is needed.      Lynden Oxford Scales, PA-C 01/10/23 1558

## 2023-01-10 NOTE — ED Triage Notes (Signed)
Pt states abdominal cramping and nausea since yesterday. States she gets this every month with her period.  Has been taking OTC medication with no relief.

## 2023-01-10 NOTE — Discharge Instructions (Signed)
Two days before you anticipate the beginning of your period, please begin taking naproxen 500 mg twice daily and continue taking naproxen until the end of your period.    Please follow up with Dr. Dorien Chihuahua at [facility] at the [facility] for further evaluation and treatment.   Thank you for visiting urgent care today.

## 2023-01-11 ENCOUNTER — Telehealth: Payer: Self-pay | Admitting: Emergency Medicine

## 2023-02-02 ENCOUNTER — Inpatient Hospital Stay: Admission: RE | Admit: 2023-02-02 | Payer: Self-pay | Source: Ambulatory Visit

## 2023-02-22 ENCOUNTER — Ambulatory Visit
Admission: EM | Admit: 2023-02-22 | Discharge: 2023-02-22 | Disposition: A | Discharge status: Home / Self Care | Payer: Medicaid Other | Attending: Nurse Practitioner | Admitting: Nurse Practitioner

## 2023-02-22 DIAGNOSIS — N760 Acute vaginitis: Secondary | ICD-10-CM

## 2023-02-22 LAB — POCT URINE PREGNANCY: Preg Test, Ur: NEGATIVE

## 2023-02-22 LAB — POCT URINALYSIS DIP (MANUAL ENTRY)
Bilirubin, UA: NEGATIVE
Blood, UA: NEGATIVE
Glucose, UA: NEGATIVE mg/dL
Nitrite, UA: NEGATIVE
Protein Ur, POC: NEGATIVE mg/dL
Spec Grav, UA: >=1.03 — AB (ref 1.010–1.025)
Urobilinogen, UA: 1 E.U./dL
pH, UA: 6 (ref 5.0–8.0)

## 2023-02-22 MED ORDER — METRONIDAZOLE 500 MG PO TABS: 500.0000 mg | ORAL_TABLET | Freq: Two times a day (BID) | ORAL | 0 refills | Status: DC

## 2023-02-22 MED ORDER — FLUCONAZOLE 150 MG PO TABS: 150.0000 mg | ORAL_TABLET | Freq: Every day | ORAL | 0 refills | Status: DC

## 2023-02-22 NOTE — ED Triage Notes (Signed)
Pt c/o vaginal discharge and odor that began yesterday.  Home interventions: Boric acid

## 2023-02-22 NOTE — Discharge Instructions (Signed)
Clinic will contact you with results of the testing done today if positive Flagyl twice daily for 7 days Diflucan as prescribed Rest and fluids Follow-up with PCP 2 to 3 days for recheck Please go to the ER for any worsening symptoms

## 2023-02-22 NOTE — ED Provider Notes (Signed)
[facility]    CSN: [id number] Arrival date & time: 02/22/23  1931      History   Chief Complaint Chief Complaint  Patient presents with   Vaginal Discharge    HPI Evelyn [name] is a 22 y.o. female resents for evaluation of vaginitis.  Patient reports 1 day of vaginal discharge with a malodorous smell.  Does report some itching as well.  States it feels similar to previous yeast and BV infections.  Denies any dysuria or STD exposure or concern.  She is use boric acid OTC.  No other concerns at this time.   Vaginal Discharge   History reviewed. No pertinent past medical history.  Patient Active Problem List   Diagnosis Date Noted   ADHD (attention deficit hyperactivity disorder), combined type 07/08/2018   Oppositional defiant disorder 07/08/2018   Major depressive disorder, recurrent severe without psychotic features ([place]) 07/07/2018    History reviewed. No pertinent surgical history.  OB History     Gravida  0   Para  0   Term  0   Preterm  0   AB  0   Living  0      SAB  0   IAB  0   Ectopic  0   Multiple  0   Live Births  0            Home Medications    Prior to Admission medications   Medication Sig Start Date End Date Taking? Authorizing Provider  fluconazole (DIFLUCAN) 150 MG tablet Take 1 tablet (150 mg total) by mouth daily. May repeat dose in 3 days if symptoms persist 02/22/23  Yes Melynda Ripple, NP  metroNIDAZOLE (FLAGYL) 500 MG tablet Take 1 tablet (500 mg total) by mouth 2 (two) times daily. 02/22/23  Yes Melynda Ripple, NP  naproxen (NAPROSYN) 500 MG tablet Two days before you anticipate your period, begin taking 500 mg twice daily and continue until the end of your period. 01/10/23   Lynden Oxford Scales, PA-C    Family History Family History  Problem Relation Age of Onset   Diabetes Mother    Alcohol abuse Father     Social History Social History   Tobacco Use   Smoking status: Every Day    Types:  E-cigarettes   Smokeless tobacco: Never  Vaping Use   Vaping Use: Every day   Substances: Nicotine, Flavoring  Substance Use Topics   Alcohol use: Not Currently   Drug use: Yes    Frequency: 4.0 times per week    Types: Marijuana     Allergies   Cats claw (uncaria tomentosa)   Review of Systems Review of Systems  Genitourinary:  Positive for vaginal discharge.     Physical Exam Triage Vital Signs ED Triage Vitals  Enc Vitals Group     BP 02/22/23 1942 114/75     Pulse Rate 02/22/23 1942 91     Resp 02/22/23 1942 16     Temp 02/22/23 1942 98.3 F (36.8 C)     Temp Source 02/22/23 1942 Oral     SpO2 02/22/23 1942 99 %     Weight --      Height --      Head Circumference --      Peak Flow --      Pain Score 02/22/23 1939 0     Pain Loc --      Pain Edu? --      Excl.  in [place]? --    No data found.  Updated Vital Signs BP 114/75 (BP Location: Left Arm)   Pulse 91   Temp 98.3 F (36.8 C) (Oral)   Resp 16   LMP 02/05/2023   SpO2 99%   Visual Acuity Right Eye Distance:   Left Eye Distance:   Bilateral Distance:    Right Eye Near:   Left Eye Near:    Bilateral Near:     Physical Exam Vitals and nursing note reviewed.  Constitutional:      Appearance: Normal appearance.  HENT:     Head: Normocephalic and atraumatic.  Eyes:     Pupils: Pupils are equal, round, and reactive to light.  Cardiovascular:     Rate and Rhythm: Normal rate.  Pulmonary:     Effort: Pulmonary effort is normal.  Abdominal:     Tenderness: There is no right CVA tenderness or left CVA tenderness.  Skin:    General: Skin is warm and dry.  Neurological:     General: No focal deficit present.     Mental Status: She is alert and oriented to person, place, and time.  Psychiatric:        Mood and Affect: Mood normal.        Behavior: Behavior normal.      UC Treatments / Results  Labs (all labs ordered are listed, but only abnormal results are displayed) Labs Reviewed   URINE CULTURE  POCT URINE PREGNANCY  CERVICOVAGINAL ANCILLARY ONLY    EKG   Radiology No results found.  Procedures Procedures (including critical care time)  Medications Ordered in UC Medications - No data to display  Initial Impression / Assessment and Plan / UC Course  I have reviewed the triage vital signs and the nursing notes.  Pertinent labs & imaging results that were available during my care of the patient were reviewed by me and considered in my medical decision making (see chart for details).     UA with trace leuks, patient asymptomatic.  Will culture Negative urine hCG Vaginal swab and will contact if any positive results Will cover for yeast and BV with Flagyl and Diflucan PCP follow-up 2 to 3 days for recheck ER precautions reviewed and patient verbalized understanding Final Clinical Impressions(s) / UC Diagnoses   Final diagnoses:  Acute vaginitis     Discharge Instructions      Clinic will contact you with results of the testing done today if positive Flagyl twice daily for 7 days Diflucan as prescribed Rest and fluids Follow-up with PCP 2 to 3 days for recheck Please go to the ER for any worsening symptoms   ED Prescriptions     Medication Sig Dispense Auth. Provider   fluconazole (DIFLUCAN) 150 MG tablet Take 1 tablet (150 mg total) by mouth daily. May repeat dose in 3 days if symptoms persist 2 tablet Melynda Ripple, NP   metroNIDAZOLE (FLAGYL) 500 MG tablet Take 1 tablet (500 mg total) by mouth 2 (two) times daily. 14 tablet Melynda Ripple, NP      PDMP not reviewed this encounter.   Melynda Ripple, NP 02/22/23 2006

## 2023-02-23 LAB — CERVICOVAGINAL ANCILLARY ONLY
Bacterial Vaginitis (gardnerella): POSITIVE — AB
Candida Glabrata: NEGATIVE
Candida Vaginitis: POSITIVE — AB
Comment: NEGATIVE
Comment: NEGATIVE
Comment: NEGATIVE

## 2023-02-25 LAB — URINE CULTURE: Culture: >=100000 — AB

## 2023-02-27 ENCOUNTER — Telehealth (HOSPITAL_COMMUNITY): Payer: Self-pay | Admitting: Emergency Medicine

## 2023-02-27 MED ORDER — AMOXICILLIN 500 MG PO CAPS: 500.0000 mg | ORAL_CAPSULE | Freq: Two times a day (BID) | ORAL | 0 refills | Status: AC

## 2023-03-27 ENCOUNTER — Ambulatory Visit (INDEPENDENT_AMBULATORY_CARE_PROVIDER_SITE_OTHER): Payer: Medicaid Other

## 2023-03-27 VITALS — BP 99/63 | HR 102 | Wt 112.5 lb

## 2023-03-27 DIAGNOSIS — R109 Unspecified abdominal pain: Secondary | ICD-10-CM

## 2023-03-27 DIAGNOSIS — O219 Vomiting of pregnancy, unspecified: Secondary | ICD-10-CM

## 2023-03-27 DIAGNOSIS — Z3401 Encounter for supervision of normal first pregnancy, first trimester: Secondary | ICD-10-CM

## 2023-03-27 DIAGNOSIS — Z3A01 Less than 8 weeks gestation of pregnancy: Secondary | ICD-10-CM

## 2023-03-27 LAB — POCT URINE PREGNANCY: Preg Test, Ur: POSITIVE — AB

## 2023-03-27 MED ORDER — VITAFOL GUMMIES 3.33-0.333-34.8 MG PO CHEW: 3.0000 | CHEWABLE_TABLET | Freq: Every day | ORAL | 12 refills | Status: DC

## 2023-03-27 MED ORDER — PROMETHAZINE HCL 25 MG PO TABS: 25.0000 mg | ORAL_TABLET | Freq: Four times a day (QID) | ORAL | 0 refills | Status: AC | PRN

## 2023-03-27 NOTE — Progress Notes (Signed)
Evelyn [name] presents today for UPT. She complains of nausea with vomiting.   LMP: 03/03/23    OBJECTIVE: Appears well, in no apparent distress.  OB History     Gravida  1   Para  0   Term  0   Preterm  0   AB  0   Living  0      SAB  0   IAB  0   Ectopic  0   Multiple  0   Live Births  0          Home UPT Result: positive  In-Office UPT result: positive  I have reviewed the patient's medical, obstetrical, social, and family histories, and medications.   ASSESSMENT: Positive pregnancy test  PLAN Prenatal care to be completed at:   [facility] at [place] and PNV's sent to the [place]. smoking cessation info given

## 2023-04-02 ENCOUNTER — Inpatient Hospital Stay (HOSPITAL_COMMUNITY)
Admission: AD | Admit: 2023-04-02 | Discharge: 2023-04-02 | Disposition: A | Discharge status: Home / Self Care | Payer: Medicaid Other | Attending: Family Medicine | Admitting: Family Medicine

## 2023-04-02 ENCOUNTER — Encounter (HOSPITAL_COMMUNITY): Payer: Self-pay | Admitting: *Deleted

## 2023-04-02 ENCOUNTER — Inpatient Hospital Stay (HOSPITAL_COMMUNITY): Payer: Medicaid Other

## 2023-04-02 DIAGNOSIS — O26891 Other specified pregnancy related conditions, first trimester: Secondary | ICD-10-CM | POA: Diagnosis present

## 2023-04-02 DIAGNOSIS — O26851 Spotting complicating pregnancy, first trimester: Secondary | ICD-10-CM | POA: Insufficient documentation

## 2023-04-02 DIAGNOSIS — O3680X0 Pregnancy with inconclusive fetal viability, not applicable or unspecified: Secondary | ICD-10-CM | POA: Diagnosis not present

## 2023-04-02 DIAGNOSIS — O200 Threatened abortion: Secondary | ICD-10-CM | POA: Diagnosis not present

## 2023-04-02 DIAGNOSIS — O23591 Infection of other part of genital tract in pregnancy, first trimester: Secondary | ICD-10-CM | POA: Insufficient documentation

## 2023-04-02 DIAGNOSIS — Z3A01 Less than 8 weeks gestation of pregnancy: Secondary | ICD-10-CM

## 2023-04-02 DIAGNOSIS — B9689 Other specified bacterial agents as the cause of diseases classified elsewhere: Secondary | ICD-10-CM | POA: Diagnosis not present

## 2023-04-02 HISTORY — DX: Urinary tract infection, site not specified: N39.0

## 2023-04-02 HISTORY — DX: Depression, unspecified: F32.A

## 2023-04-02 LAB — URINALYSIS, ROUTINE W REFLEX MICROSCOPIC
Bacteria, UA: NONE SEEN
Bilirubin Urine: NEGATIVE
Glucose, UA: NEGATIVE mg/dL
Ketones, ur: NEGATIVE mg/dL
Leukocytes,Ua: NEGATIVE
Nitrite: NEGATIVE
Protein, ur: NEGATIVE mg/dL
Specific Gravity, Urine: 1.023 (ref 1.005–1.030)
pH: 5 (ref 5.0–8.0)

## 2023-04-02 LAB — CBC
HCT: 40 % (ref 36.0–46.0)
Hemoglobin: 13.5 g/dL (ref 12.0–15.0)
MCH: 29.2 pg (ref 26.0–34.0)
MCHC: 33.8 g/dL (ref 30.0–36.0)
MCV: 86.6 fL (ref 80.0–100.0)
Platelets: 317 10*3/uL (ref 150–400)
RBC: 4.62 MIL/uL (ref 3.87–5.11)
RDW: 14.5 % (ref 11.5–15.5)
WBC: 8.2 10*3/uL (ref 4.0–10.5)
nRBC: 0 % (ref 0.0–0.2)

## 2023-04-02 LAB — WET PREP, GENITAL
Sperm: NONE SEEN
Trich, Wet Prep: NONE SEEN
WBC, Wet Prep HPF POC: <10 (ref <10)
Yeast Wet Prep HPF POC: NONE SEEN

## 2023-04-02 LAB — HCG, QUANTITATIVE, PREGNANCY: hCG, Beta Chain, Quant, S: 26 m[IU]/mL — ABNORMAL HIGH (ref <5)

## 2023-04-02 LAB — ABO/RH: ABO/RH(D): B POS

## 2023-04-02 MED ORDER — METRONIDAZOLE 500 MG PO TABS: 500.0000 mg | ORAL_TABLET | Freq: Two times a day (BID) | ORAL | 0 refills | Status: DC

## 2023-04-02 MED ORDER — TERCONAZOLE 0.4 % VA CREA: 1.0000 | TOPICAL_CREAM | Freq: Every day | VAGINAL | 0 refills | Status: DC

## 2023-04-02 NOTE — MAU Note (Signed)
Evelyn [name] is a 22 y.o. at [redacted]w[redacted]d here in MAU reporting: when she woke up this morning, she was cramping real bed. Saw pink when she wiped, then dk red, had some very tiny little clots in toilet. Had intercourse Friday.   Onset of complaint: this morning Pain score: moderate Vitals:   04/02/23 0924  BP: 113/68  Pulse: 77  Resp: 16  Temp: 98.1 F (36.7 C)  SpO2: 100%      Lab orders placed from triage:  UA, swabs

## 2023-04-02 NOTE — MAU Provider Note (Addendum)
Chief Complaint:  Abdominal Pain and Vaginal Bleeding  HPI   Event Date/Time   First Provider Initiated Contact with Patient 04/02/23 1108     Evelyn [name] is a 22 y.o. G1P0000 at [redacted]w[redacted]d who presents to maternity admissions reporting new onset cramping this morning with pink to dark red (in the toilet after voiding) bleeding. Now only faintly pink with wiping. Last IC on Friday. No other physical complaints.   Pregnancy Course: Has not started OB care yet, but has an appt at [facility] for early May.  Past Medical History:  Diagnosis Date   Depression    UTI (urinary tract infection)    OB History  Gravida Para Term Preterm AB Living  1 0 0 0 0 0  SAB IAB Ectopic Multiple Live Births  0 0 0 0 0    # Outcome Date GA Lbr Len/2nd Weight Sex Delivery Anes PTL Lv  1 Current            Past Surgical History:  Procedure Laterality Date   NO PAST SURGERIES     Family History  Problem Relation Age of Onset   Diabetes Mother    Diabetes Father    Alcohol abuse Father    Social History   Tobacco Use   Smoking status: Former    Types: E-cigarettes    Passive exposure: Past   Smokeless tobacco: Never  Vaping Use   Vaping Use: Former   Substances: Nicotine, Flavoring  Substance Use Topics   Alcohol use: Not Currently   Drug use: Yes    Frequency: 7.0 times per week    Types: Marijuana   Allergies  Allergen Reactions   Cats Claw (Uncaria Tomentosa) Itching   No medications prior to admission.   I have reviewed patient's Past Medical Hx, Surgical Hx, Family Hx, Social Hx, medications and allergies.   ROS  Pertinent items noted in HPI and remainder of comprehensive ROS otherwise negative.   PHYSICAL EXAM  Patient Vitals for the past 24 hrs:  BP Temp Temp src Pulse Resp SpO2 Height Weight  04/02/23 1127 121/74 -- -- 64 16 -- -- --  04/02/23 0924 113/68 98.1 F (36.7 C) Oral 77 16 100 % 5' 2.5" (1.588 m) 114 lb 14.4 oz (52.1 kg)   Constitutional: Well-developed,  well-nourished female in no acute distress.  Cardiovascular: normal rate & rhythm, warm and well-perfused Respiratory: normal effort, no problems with respiration noted GI: Abd soft, non-tender, non-distended MS: Extremities nontender, no edema, normal ROM Neurologic: Alert and oriented x 4.  GU: no CVA tenderness Pelvic: self-swabs collected, bleeding only pink with wiping   Labs: Results for orders placed or performed during the hospital encounter of 04/02/23 (from the past 24 hour(s))  Wet prep, genital     Status: Abnormal   Collection Time: 04/02/23  9:39 AM  Result Value Ref Range   Yeast Wet Prep HPF POC NONE SEEN NONE SEEN   Trich, Wet Prep NONE SEEN NONE SEEN   Clue Cells Wet Prep HPF POC PRESENT (A) NONE SEEN   WBC, Wet Prep HPF POC <10 <10   Sperm NONE SEEN   Urinalysis, Routine w reflex microscopic -Urine, Clean Catch     Status: Abnormal   Collection Time: 04/02/23  9:39 AM  Result Value Ref Range   Color, Urine YELLOW YELLOW   APPearance HAZY (A) CLEAR   Specific Gravity, Urine 1.023 1.005 - 1.030   pH 5.0 5.0 - 8.0   Glucose, UA  NEGATIVE NEGATIVE mg/dL   Hgb urine dipstick LARGE (A) NEGATIVE   Bilirubin Urine NEGATIVE NEGATIVE   Ketones, ur NEGATIVE NEGATIVE mg/dL   Protein, ur NEGATIVE NEGATIVE mg/dL   Nitrite NEGATIVE NEGATIVE   Leukocytes,Ua NEGATIVE NEGATIVE   RBC / HPF 11-20 0 - 5 RBC/hpf   WBC, UA 0-5 0 - 5 WBC/hpf   Bacteria, UA NONE SEEN NONE SEEN   Squamous Epithelial / HPF 6-10 0 - 5 /HPF   Mucus PRESENT   ABO/Rh     Status: None   Collection Time: 04/02/23  9:43 AM  Result Value Ref Range   ABO/RH(D) B POS    No rh immune globuloin      NOT A RH IMMUNE GLOBULIN CANDIDATE, PT RH POSITIVE Performed at [facility], [address], [place], [place] [address]   CBC     Status: None   Collection Time: 04/02/23  9:49 AM  Result Value Ref Range   WBC 8.2 4.0 - 10.5 K/uL   RBC 4.62 3.87 - 5.11 MIL/uL   Hemoglobin 13.5 12.0 - 15.0 g/dL   HCT  93.8 18.2 - 99.3 %   MCV 86.6 80.0 - 100.0 fL   MCH 29.2 26.0 - 34.0 pg   MCHC 33.8 30.0 - 36.0 g/dL   RDW 71.6 96.7 - 89.3 %   Platelets 317 150 - 400 K/uL   nRBC 0.0 0.0 - 0.2 %  hCG, quantitative, pregnancy     Status: Abnormal   Collection Time: 04/02/23  9:49 AM  Result Value Ref Range   hCG, Beta Chain, Quant, S 26 (H) <5 mIU/mL   Imaging:  US OB Comp Less 14 Wks  Result Date: 04/02/2023 CLINICAL DATA:  Abdominal pain and vaginal bleeding. Positive pregnancy test. EXAM: OBSTETRIC <14 WK ULTRASOUND TECHNIQUE: Transabdominal ultrasound was performed for evaluation of the gestation as well as the maternal uterus and adnexal regions. Patient refused transvaginal sonography. COMPARISON:  None Available. FINDINGS: Uterus was not visualized by transabdominal sonography, and endometrial cavity could not be assessed. Neither ovary was visualized. Large amount of bowel gas noted, patient's urinary bladder is empty. No gross free fluid seen. IMPRESSION: Nondiagnostic exam, as described above. Electronically Signed   By: Danae Orleans M.D.   On: 04/02/2023 11:47    MDM & MAU COURSE  MDM: Moderate  MAU Course: Orders Placed This Encounter  Procedures   Wet prep, genital   US OB Comp Less 14 Wks   CBC   hCG, quantitative, pregnancy   Urinalysis, Routine w reflex microscopic -Urine, Clean Catch   ABO/Rh   Discharge patient   Meds ordered this encounter  Medications   metroNIDAZOLE (FLAGYL) 500 MG tablet    Sig: Take 1 tablet (500 mg total) by mouth 2 (two) times daily.    Dispense:  14 tablet    Refill:  0   terconazole (TERAZOL 7) 0.4 % vaginal cream    Sig: Place 1 applicator vaginally at bedtime.    Dispense:  45 g    Refill:  0   Ectopic workup ordered from triage due to pt stability and MAU acuity. Pt refused TVUS but HCG only 26, so unlikely to see anything in the uterus. BV likely cause of pink discharge, meds sent to pharmacy. Pt ok to follow [facility] at [facility] for stat bHCG,  reviewed possible findings including SAB, ectopic or appropriately rising quant. Will get repeat ultrasound in two weeks, may need TVUS to visualize pregnancy. Pt  understanding.  ASSESSMENT   1. Pregnancy of unknown anatomic location   2. Bacterial vaginosis    PLAN  Discharge home in stable condition with ectopic and bleeding precautions.  - [facility]    Follow-up Information     [facility] up in 2 day(s).   Specialty: Obstetrics and Gynecology Why: for repeat bloodwork Contact information: [address], [address] [place] [id number] [phone]                Allergies as of 04/02/2023       Reactions   Cats Claw (uncaria Tomentosa) Itching        Medication List     STOP taking these medications    fluconazole 150 MG tablet Commonly known as: Diflucan   naproxen 500 MG tablet Commonly known as: NAPROSYN       TAKE these medications    metroNIDAZOLE 500 MG tablet Commonly known as: FLAGYL Take 1 tablet (500 mg total) by mouth 2 (two) times daily.   promethazine 25 MG tablet Commonly known as: PHENERGAN Take 1 tablet (25 mg total) by mouth every 6 (six) hours as needed for nausea or vomiting.   terconazole 0.4 % vaginal cream Commonly known as: TERAZOL 7 Place 1 applicator vaginally at bedtime.   Vitafol Gummies 3.33-0.333-34.8 MG Chew Chew 3 tablets by mouth daily.       Edd ArbourJamilla Karlea Mckibbin, CNM, MSN, IBCLC Certified Nurse Midwife, [facility]

## 2023-04-03 ENCOUNTER — Inpatient Hospital Stay (HOSPITAL_COMMUNITY)
Admission: AD | Admit: 2023-04-03 | Discharge: 2023-04-03 | Disposition: A | Discharge status: Home / Self Care | Payer: Medicaid Other | Attending: Obstetrics & Gynecology | Admitting: Obstetrics & Gynecology

## 2023-04-03 DIAGNOSIS — O200 Threatened abortion: Secondary | ICD-10-CM | POA: Diagnosis not present

## 2023-04-03 DIAGNOSIS — O4691 Antepartum hemorrhage, unspecified, first trimester: Secondary | ICD-10-CM | POA: Diagnosis present

## 2023-04-03 DIAGNOSIS — Z3A01 Less than 8 weeks gestation of pregnancy: Secondary | ICD-10-CM | POA: Insufficient documentation

## 2023-04-03 DIAGNOSIS — O209 Hemorrhage in early pregnancy, unspecified: Secondary | ICD-10-CM

## 2023-04-03 LAB — GC/CHLAMYDIA PROBE AMP (~~LOC~~) NOT AT ARMC
Chlamydia: NEGATIVE
Comment: NEGATIVE
Comment: NORMAL
Neisseria Gonorrhea: NEGATIVE

## 2023-04-03 LAB — HCG, QUANTITATIVE, PREGNANCY: hCG, Beta Chain, Quant, S: 18 m[IU]/mL — ABNORMAL HIGH (ref <5)

## 2023-04-03 NOTE — MAU Provider Note (Signed)
Event Date/Time   First Provider Initiated Contact with Patient 04/03/23 0523     S Ms. Evelyn [name] is a 22 y.o. G1P0000 patient who presents to MAU today with complaint of worsening vaginal bleeding. She was seen in MAU yesterday with similar complaints but reports the bleeding is heavier. She reports intermittent cramping.    O BP 101/61 (BP Location: Right Arm)   Pulse 60   Temp 98 F (36.7 C) (Oral)   Resp 17   Ht 5\' 2"  (1.575 m)   Wt 51.3 kg   LMP 03/03/2023   BMI 20.67 kg/m  Physical Exam Vitals and nursing note reviewed.  Constitutional:      General: She is not in acute distress.    Appearance: She is well-developed.  HENT:     Head: Normocephalic.  Eyes:     Pupils: Pupils are equal, round, and reactive to light.  Cardiovascular:     Rate and Rhythm: Normal rate and regular rhythm.     Heart sounds: Normal heart sounds.  Pulmonary:     Effort: Pulmonary effort is normal. No respiratory distress.     Breath sounds: Normal breath sounds.  Abdominal:     General: Bowel sounds are normal. There is no distension.     Palpations: Abdomen is soft.     Tenderness: There is no abdominal tenderness.  Skin:    General: Skin is warm and dry.  Neurological:     Mental Status: She is alert and oriented to person, place, and time.  Psychiatric:        Mood and Affect: Mood normal.        Behavior: Behavior normal.        Thought Content: Thought content normal.        Judgment: Judgment normal.    Results for orders placed or performed during the hospital encounter of 04/03/23 (from the past 24 hour(s))  hCG, quantitative, pregnancy     Status: Abnormal   Collection Time: 04/03/23  4:25 AM  Result Value Ref Range   hCG, Beta Chain, Quant, S 18 (H) <5 mIU/mL    A Medical screening exam complete 1. Vaginal bleeding affecting early pregnancy   2. Threatened miscarriage   3. [redacted] weeks gestation of pregnancy    -Discussed falling HCG is likely miscarriage.  Encouraged patient to keep HCG appointment as scheduled for confirmation.  P Discharge from MAU in stable condition Patient given the option of transfer to [facility] for further evaluation or seek care in outpatient facility of choice  List of options for follow-up given  Warning signs for worsening condition that would warrant emergency follow-up discussed Patient may return to MAU as needed   Rolm Bookbinder, [place] 04/03/2023 5:24 AM

## 2023-04-03 NOTE — MAU Note (Signed)
.  Evelyn [name] is a 22 y.o. at [redacted]w[redacted]d here in MAU reporting: pt awoken from sleep at 0200 with large amount of bright red bleeding"like a regular period"-pt denies passing clots or tissue Reports uterine cramping -worse yesterday-currently rates lower uterine cramping at 5 on 0-10 scale   Onset of complaint: 0200 Pain score: 5 Vitals:   04/03/23 0415  BP: 101/61  Pulse: 60  Resp: 17  Temp: 98 F (36.7 C)

## 2023-04-04 ENCOUNTER — Other Ambulatory Visit: Payer: Self-pay

## 2023-04-04 ENCOUNTER — Telehealth: Payer: Self-pay | Admitting: Emergency Medicine

## 2023-04-04 ENCOUNTER — Ambulatory Visit (INDEPENDENT_AMBULATORY_CARE_PROVIDER_SITE_OTHER): Payer: Medicaid Other | Admitting: Emergency Medicine

## 2023-04-04 VITALS — BP 112/76 | HR 85 | Wt 112.0 lb

## 2023-04-04 DIAGNOSIS — Z3A01 Less than 8 weeks gestation of pregnancy: Secondary | ICD-10-CM

## 2023-04-04 DIAGNOSIS — O200 Threatened abortion: Secondary | ICD-10-CM | POA: Diagnosis not present

## 2023-04-04 DIAGNOSIS — O209 Hemorrhage in early pregnancy, unspecified: Secondary | ICD-10-CM

## 2023-04-04 LAB — BETA HCG QUANT (REF LAB): hCG Quant: 7 m[IU]/mL

## 2023-04-04 NOTE — Progress Notes (Signed)
Pt presents for Stat BetaHCG f/u. LMP 03/03/23, pt began having abdominal cramping and bleeding on 4/7. Reports that she is still bleeding today. Cramping has resolved.  Beta HCG drawn today in office.

## 2023-04-04 NOTE — Telephone Encounter (Signed)
Pt informed of 4/9 HCG results. Message sent to front desk to schedule pt for repeat hcg in 1 week.

## 2023-04-11 ENCOUNTER — Other Ambulatory Visit: Payer: Medicaid Other

## 2023-04-14 ENCOUNTER — Other Ambulatory Visit: Payer: Medicaid Other

## 2023-04-18 ENCOUNTER — Other Ambulatory Visit: Payer: Medicaid Other

## 2023-05-04 ENCOUNTER — Other Ambulatory Visit: Payer: Self-pay

## 2023-05-04 ENCOUNTER — Emergency Department (HOSPITAL_COMMUNITY)
Admission: EM | Admit: 2023-05-04 | Discharge: 2023-05-05 | Disposition: A | Discharge status: Home / Self Care | Payer: Medicaid Other | Attending: Student | Admitting: Student

## 2023-05-04 ENCOUNTER — Encounter (HOSPITAL_COMMUNITY): Payer: Self-pay

## 2023-05-04 DIAGNOSIS — R1013 Epigastric pain: Secondary | ICD-10-CM

## 2023-05-04 DIAGNOSIS — E876 Hypokalemia: Secondary | ICD-10-CM | POA: Insufficient documentation

## 2023-05-04 DIAGNOSIS — D72829 Elevated white blood cell count, unspecified: Secondary | ICD-10-CM | POA: Diagnosis not present

## 2023-05-04 LAB — CBC
HCT: 38.4 % (ref 36.0–46.0)
Hemoglobin: 13.1 g/dL (ref 12.0–15.0)
MCH: 29.5 pg (ref 26.0–34.0)
MCHC: 34.1 g/dL (ref 30.0–36.0)
MCV: 86.5 fL (ref 80.0–100.0)
Platelets: 308 10*3/uL (ref 150–400)
RBC: 4.44 MIL/uL (ref 3.87–5.11)
RDW: 14.2 % (ref 11.5–15.5)
WBC: 11.8 10*3/uL — ABNORMAL HIGH (ref 4.0–10.5)
nRBC: 0 % (ref 0.0–0.2)

## 2023-05-04 LAB — URINALYSIS, ROUTINE W REFLEX MICROSCOPIC
Bilirubin Urine: NEGATIVE
Glucose, UA: NEGATIVE mg/dL
Hgb urine dipstick: NEGATIVE
Ketones, ur: 20 mg/dL — AB
Leukocytes,Ua: NEGATIVE
Nitrite: NEGATIVE
Protein, ur: NEGATIVE mg/dL
Specific Gravity, Urine: 1.01 (ref 1.005–1.030)
pH: 5 (ref 5.0–8.0)

## 2023-05-04 LAB — PREGNANCY, URINE: Preg Test, Ur: NEGATIVE

## 2023-05-04 MED ORDER — FAMOTIDINE IN NACL 20-0.9 MG/50ML-% IV SOLN
20.0000 mg | Freq: Once | INTRAVENOUS | Status: AC
  Administered 2023-05-04: 20 mg via INTRAVENOUS
  Filled 2023-05-04: qty 50

## 2023-05-04 MED ORDER — SODIUM CHLORIDE 0.9 % IV BOLUS
1000.0000 mL | Freq: Once | INTRAVENOUS | Status: AC
  Administered 2023-05-04: 1000 mL via INTRAVENOUS

## 2023-05-04 NOTE — ED Triage Notes (Signed)
Pt c/o epigastric abdominal pain x 2.5 weeks. Describes it as if she is having "hunger pains". Pt states that she is having episodes where she starts salivating as if she is going to vomit, but isn't vomiting. States that she has also had a decreased appetite. Recently had miscarriage in April. LMP 05/01/23.

## 2023-05-05 LAB — COMPREHENSIVE METABOLIC PANEL
ALT: 14 U/L (ref 0–44)
AST: 23 U/L (ref 15–41)
Albumin: 3.9 g/dL (ref 3.5–5.0)
Alkaline Phosphatase: 55 U/L (ref 38–126)
Anion gap: 10 (ref 5–15)
BUN: 8 mg/dL (ref 6–20)
CO2: 21 mmol/L — ABNORMAL LOW (ref 22–32)
Calcium: 8.9 mg/dL (ref 8.9–10.3)
Chloride: 106 mmol/L (ref 98–111)
Creatinine, Ser: 0.74 mg/dL (ref 0.44–1.00)
GFR, Estimated: >60 mL/min (ref >60)
Glucose, Bld: 84 mg/dL (ref 70–99)
Potassium: 3.2 mmol/L — ABNORMAL LOW (ref 3.5–5.1)
Sodium: 137 mmol/L (ref 135–145)
Total Bilirubin: 1.1 mg/dL (ref 0.3–1.2)
Total Protein: 6.8 g/dL (ref 6.5–8.1)

## 2023-05-05 LAB — LIPASE, BLOOD: Lipase: 36 U/L (ref 11–51)

## 2023-05-05 MED ORDER — ONDANSETRON 4 MG PO TBDP: 4.0000 mg | ORAL_TABLET | Freq: Three times a day (TID) | ORAL | 0 refills | Status: DC | PRN

## 2023-05-05 MED ORDER — FAMOTIDINE 20 MG PO TABS: 20.0000 mg | ORAL_TABLET | Freq: Every day | ORAL | 0 refills | Status: DC

## 2023-05-05 NOTE — Discharge Instructions (Signed)
Your workup today was reassuring, I recommend taking no additional ibuprofen and relying on Tylenol 1000 mg before work.  Avoid alcohol or other NSAID such as Aleve, naproxen, meloxicam.  Follow-up with your primary care provider and I have given you the information for an OB/GYN to follow-up with.  I recommend holding off on having sex until you are able to start on the birth control method.

## 2023-05-05 NOTE — ED Provider Notes (Signed)
[place] EMERGENCY DEPARTMENT AT [facility] Provider Note   CSN: [id number] Arrival date & time: 05/04/23  2230     History  Chief Complaint  Patient presents with   Abdominal Pain    Evelyn [name] is a 22 y.o. female.   Abdominal Pain Patient is a 22 year old female without pertinent past medical history no past surgical history presents to emergency room today with complaints of epigastric abd pain has been ongoing for approximate 2.5 weeks.  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  She states over the past 2.5 weeks she has noticed that she has had epigastric pain every night.  She denies any black or tarry stool.  She states occasionally it seems that she has some increase in her discomfort with eating.  She also does drink alcohol regularly.  She states that she has had several episodes of nonbloody nonbilious emesis over the past couple weeks.  She also indicates that she had a recent miscarriage approximately 3.5 weeks ago.  She also had her normal menstrual cycle 05/01/2023 and had a negative pregnancy test around that time.  She denies any chest pain difficulty breathing no hemoptysis lightheadedness or dizziness.  Denies any vaginal discharge and states that she is no longer having any vaginal bleeding her menstrual cycle has ceased.  She denies any pelvic pain she denies any urinary frequency urgency dysuria or hematuria.     Home Medications Prior to Admission medications   Medication Sig Start Date End Date Taking? Authorizing Provider  famotidine (PEPCID) 20 MG tablet Take 1 tablet (20 mg total) by mouth daily. 05/05/23  Yes Elky Funches S, PA  ibuprofen (ADVIL) 200 MG tablet Take 800 mg by mouth daily as needed for moderate pain.   Yes [provider]  ondansetron (ZOFRAN-ODT) 4 MG disintegrating tablet Take 1 tablet (4 mg total) by mouth every 8 (eight) hours as needed for  nausea or vomiting. 05/05/23  Yes Dorleen Kissel S, PA  metroNIDAZOLE (FLAGYL) 500 MG tablet Take 1 tablet (500 mg total) by mouth 2 (two) times daily. Patient not taking: Reported on 05/04/2023 04/02/23   Bernerd Limbo, CNM  Prenatal Vit-Fe Phos-FA-Omega (VITAFOL GUMMIES) 3.33-0.333-34.8 MG CHEW Chew 3 tablets by mouth daily. Patient not taking: Reported on 05/04/2023 03/27/23   Lennart Pall, MD  promethazine (PHENERGAN) 25 MG tablet Take 1 tablet (25 mg total) by mouth every 6 (six) hours as needed for nausea or vomiting. Patient not taking: Reported on 05/04/2023 03/27/23   Lennart Pall, MD  terconazole (TERAZOL 7) 0.4 % vaginal cream Place 1 applicator vaginally at bedtime. Patient not taking: Reported on 05/04/2023 04/02/23   Bernerd Limbo, CNM      Allergies    Cats claw (uncaria tomentosa)    Review of Systems   Review of Systems  Gastrointestinal:  Positive for abdominal pain.    Physical Exam Updated Vital Signs BP 115/74 (BP Location: Right Arm)   Pulse 82   Temp 97.8 F (36.6 C) (Oral)   Resp 16   Ht 5\' 2"  (1.575 m)   Wt 48.5 kg   LMP 04/01/2023   SpO2 100%   Breastfeeding No   BMI 19.57 kg/m  Physical Exam Vitals and nursing note reviewed.  Constitutional:      General: She is not in acute distress.    Comments: Pleasant well-appearing 22 year old.  In no acute  distress.  Sitting comfortably in bed.  Able answer questions appropriately follow commands. No increased work of breathing. Speaking in full sentences.   HENT:     Head: Normocephalic and atraumatic.     Nose: Nose normal.  Eyes:     General: No scleral icterus. Cardiovascular:     Rate and Rhythm: Normal rate and regular rhythm.     Pulses: Normal pulses.     Heart sounds: Normal heart sounds.  Pulmonary:     Effort: Pulmonary effort is normal. No respiratory distress.     Breath sounds: No wheezing.  Abdominal:     Palpations: Abdomen is soft.     Tenderness: There is no abdominal  tenderness.  Musculoskeletal:     Cervical back: Normal range of motion.     Right lower leg: No edema.     Left lower leg: No edema.  Skin:    General: Skin is warm and dry.     Capillary Refill: Capillary refill takes less than 2 seconds.  Neurological:     Mental Status: She is alert. Mental status is at baseline.  Psychiatric:        Mood and Affect: Mood normal.        Behavior: Behavior normal.     ED Results / Procedures / Treatments   Labs (all labs ordered are listed, but only abnormal results are displayed) Labs Reviewed  COMPREHENSIVE METABOLIC PANEL - Abnormal; Notable for the following components:      Result Value   Potassium 3.2 (*)    CO2 21 (*)    All other components within normal limits  CBC - Abnormal; Notable for the following components:   WBC 11.8 (*)    All other components within normal limits  URINALYSIS, ROUTINE W REFLEX MICROSCOPIC - Abnormal; Notable for the following components:   Ketones, ur 20 (*)    All other components within normal limits  LIPASE, BLOOD  PREGNANCY, URINE    EKG None  Radiology No results found.  Procedures Procedures    Medications Ordered in ED Medications  sodium chloride 0.9 % bolus 1,000 mL (0 mLs Intravenous Stopped 05/05/23 0109)  famotidine (PEPCID) IVPB 20 mg premix (0 mg Intravenous Stopped 05/05/23 0030)    ED Course/ Medical Decision Making/ A&P Clinical Course as of 05/05/23 0247  Thu May 04, 2023  2313 Miscairrage 3.5 weeks ago.   2.5 weeks ago pt started having some abd pain - upper abd. Some nausea and occasional vomiting but mostly nausea. Mostly at night.   No vaginal DC other than bleeding LMP 5/6 No urinary sx, no more bleeding since yesterday.  [WF]    Clinical Course User Index [WF] Gailen Shelter, PA                             Medical Decision Making Amount and/or Complexity of Data Reviewed Labs: ordered.  Risk Prescription drug management.   This patient presents to  the ED for concern of abd pain, this involves a number of treatment options, and is a complaint that carries with it a moderate to high risk of complications and morbidity. A differential diagnosis was considered for the patient's symptoms which is discussed below:   The causes of generalized abdominal pain include but are not limited to AAA, mesenteric ischemia, appendicitis, diverticulitis, DKA, gastritis, gastroenteritis, AMI, nephrolithiasis, pancreatitis, peritonitis, adrenal insufficiency,lead poisoning, iron toxicity, intestinal ischemia, constipation, UTI,SBO/LBO, splenic rupture,  biliary disease, IBD, IBS, PUD, or hepatitis. Ectopic pregnancy, ovarian torsion, PID.    Co morbidities: Discussed in HPI   Brief History:  Patient is a 22 year old female without pertinent past medical history no past surgical history presents to emergency room today with complaints of epigastric abd pain has been ongoing for approximate 2.5 weeks.  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  She states over the past 2.5 weeks she has noticed that she has had epigastric pain every night.  She denies any black or tarry stool.  She states occasionally it seems that she has some increase in her discomfort with eating.  She also does drink alcohol regularly.  She states that she has had several episodes of nonbloody nonbilious emesis over the past couple weeks.  She also indicates that she had a recent miscarriage approximately 3.5 weeks ago.  She also had her normal menstrual cycle 05/01/2023 and had a negative pregnancy test around that time.  She denies any chest pain difficulty breathing no hemoptysis lightheadedness or dizziness.  Denies any vaginal discharge and states that she is no longer having any vaginal bleeding her menstrual cycle has ceased.  She denies any pelvic pain she denies any urinary frequency urgency dysuria or hematuria.    EMR  reviewed including pt PMHx, past surgical history and past visits to ER.   See HPI for more details   Lab Tests:   I ordered and independently interpreted labs. Labs notable for Very mild nonspecific leukocytosis of 11.8 no anemia, lipase within normal limits CMP unremarkable apart from very mild hypokalemia perhaps from some episodes of emesis over the past 2 weeks.  Urine pregnancy negative, urinalysis with few ketones likely from poor p.o. intake.  Imaging Studies:  No imaging studies ordered for this patient    Cardiac Monitoring:  The patient was maintained on a cardiac monitor.  I personally viewed and interpreted the cardiac monitored which showed an underlying rhythm of: NSR NA   Medicines ordered:  I ordered medication including Pepcid, 1 L of normal saline for abdominal discomfort Reevaluation of the patient after these medicines showed that the patient resolved I have reviewed the patients home medicines and have made adjustments as needed   Critical Interventions:     Consults/Attending Physician      Reevaluation:  After the interventions noted above I re-evaluated patient and found that they have :resolved   Social Determinants of Health:      Problem List / ED Course:  Patient with somewhat vague abdominal pain for 2.5 weeks seems to be epigastric discomfort loosely connected to eating and does take regular ibuprofen.  Suspect gastritis related to alcohol and NSAIDs versus peptic ulcer disease will initiate trial of famotidine and give Zofran for any nausea she experiences.  She is tolerating p.o. here in the emergency room.  No pain currently will discharge home with return precautions.  She is agreeable to plan I discussed the case with her sister as well who also agrees with this plan.  She will use Tylenol instead of ibuprofen for the time being and abstain from alcohol.   Dispostion:  After consideration of the diagnostic results and the  patients response to treatment, I feel that the patent would benefit from outpatient follow-up.  Final Clinical Impression(s) / ED Diagnoses Final diagnoses:  Epigastric abdominal pain    Rx / DC Orders ED Discharge Orders  Ordered    famotidine (PEPCID) 20 MG tablet  Daily        05/05/23 0055    ondansetron (ZOFRAN-ODT) 4 MG disintegrating tablet  Every 8 hours PRN        05/05/23 0247              Gailen Shelter, PA 05/05/23 0248    Nira Conn, MD 05/05/23 [phone]

## 2023-05-25 ENCOUNTER — Encounter: Payer: Self-pay | Admitting: Medical

## 2023-06-27 ENCOUNTER — Encounter: Payer: Self-pay | Admitting: Adult Health

## 2023-06-27 ENCOUNTER — Ambulatory Visit (INDEPENDENT_AMBULATORY_CARE_PROVIDER_SITE_OTHER): Payer: Medicaid Other | Admitting: Adult Health

## 2023-06-27 ENCOUNTER — Other Ambulatory Visit (HOSPITAL_COMMUNITY)
Admission: RE | Admit: 2023-06-27 | Discharge: 2023-06-27 | Disposition: A | Discharge status: Home / Self Care | Payer: Medicaid Other | Source: Ambulatory Visit | Attending: Obstetrics & Gynecology | Admitting: Obstetrics & Gynecology

## 2023-06-27 VITALS — BP 96/64 | HR 90 | Ht 63.0 in | Wt 117.0 lb

## 2023-06-27 DIAGNOSIS — Z01419 Encounter for gynecological examination (general) (routine) without abnormal findings: Secondary | ICD-10-CM | POA: Diagnosis not present

## 2023-06-27 DIAGNOSIS — N898 Other specified noninflammatory disorders of vagina: Secondary | ICD-10-CM | POA: Diagnosis not present

## 2023-06-27 DIAGNOSIS — Z113 Encounter for screening for infections with a predominantly sexual mode of transmission: Secondary | ICD-10-CM

## 2023-06-27 DIAGNOSIS — Z8759 Personal history of other complications of pregnancy, childbirth and the puerperium: Secondary | ICD-10-CM | POA: Diagnosis not present

## 2023-06-27 DIAGNOSIS — N946 Dysmenorrhea, unspecified: Secondary | ICD-10-CM | POA: Diagnosis not present

## 2023-06-27 DIAGNOSIS — F1290 Cannabis use, unspecified, uncomplicated: Secondary | ICD-10-CM

## 2023-06-27 LAB — POCT URINE PREGNANCY: Preg Test, Ur: NEGATIVE

## 2023-06-27 LAB — POCT URINALYSIS DIPSTICK
Blood, UA: NEGATIVE
Glucose, UA: NEGATIVE
Leukocytes, UA: NEGATIVE

## 2023-06-27 NOTE — Progress Notes (Signed)
Patient ID: Evelyn [name], female   DOB: December 26, 2001, 22 y.o.   MRN: [id number] History of Present Illness: Evelyn [name] is a 22 year old black female,single, G1P0010, in for a well woman gyn exam. She had miscarriage in April.   Current Medications, Allergies, Past Medical History, Past Surgical History, Family History and Social History were reviewed in [employer] record.     Review of Systems: Patient denies any headaches, hearing loss, fatigue, blurred vision, shortness of breath, chest pain, abdominal pain, problems with bowel movements, urination, or intercourse. No joint pain or mood swings.  +Cramping and has painful periods    Physical Exam:BP 96/64 (BP Location: Left Arm, Patient Position: Sitting, Cuff Size: Normal)   Pulse 90   Ht 5\' 3"  (1.6 m)   Wt 117 lb (53.1 kg)   LMP 06/18/2023   BMI 20.73 kg/m  UPT is negative, urine dipstick is negative  General:  Well developed, well nourished, no acute distress Skin:  Warm and dry Neck:  Midline trachea, normal thyroid, good ROM, no lymphadenopathy Lungs; Clear to auscultation bilaterally Breast:  No dominant palpable mass, retraction, or nipple discharge Cardiovascular: Regular rate and rhythm Abdomen:  Soft, non tender, no hepatosplenomegaly Pelvic:  External genitalia is normal in appearance, no lesions.  The vagina has white discharge without odor. Urethra has no lesions or masses. The cervix is smooth, CV swab obtained.  Uterus is felt to be normal size, shape, and contour.  No adnexal masses or tenderness noted.Bladder is non tender, no masses felt. Extremities/musculoskeletal:  No swelling or varicosities noted, no clubbing or cyanosis Psych:  No mood changes, alert and cooperative,seems happy AA is 1 Fall risk is low    06/27/2023    3:48 PM  Depression screen PHQ 2/9  Decreased Interest 0  Down, Depressed, Hopeless 0  PHQ - 2 Score 0  Altered sleeping 0  Tired, decreased energy 0  Change in  appetite 0  Feeling bad or failure about yourself  0  Trouble concentrating 0  Moving slowly or fidgety/restless 0  Suicidal thoughts 0  PHQ-9 Score 0       06/27/2023    3:48 PM  GAD 7 : Generalized Anxiety Score  Nervous, Anxious, on Edge 0  Control/stop worrying 0  Worry too much - different things 0  Trouble relaxing 0  Restless 0  Easily annoyed or irritable 0  Afraid - awful might happen 0  Total GAD 7 Score 0    Upstream - 06/27/23 1547       Pregnancy Intention Screening   Does the patient want to become pregnant in the next year? No    Does the patient's partner want to become pregnant in the next year? No    Would the patient like to discuss contraceptive options today? No      Contraception Wrap Up   Current Method No Contraceptive Precautions    End Method Abstinence    Contraception Counseling Provided No    How was the end contraceptive method provided? N/A              Examination chaperoned by Tish RN  Impression and Plan: 1. Encounter for well woman exam with routine gynecological exam Physical in 1 year Pap at 22  2. Dysmenorrhea Declines meds or birth control - POCT urinalysis dipstick,negative   3. Vaginal discharge CV swab sent for GC/CHL,trich,BV and yeast - Cervicovaginal ancillary only( [place])  4. Screening examination for STD (sexually  transmitted disease) CV swab sent She declines HIV and PRP - Cervicovaginal ancillary only( [place])  5. History of miscarriage UPT is negative - POCT urine pregnancy  6. Marijuana user Counseled to stop or at least cut down on use

## 2023-06-30 LAB — CERVICOVAGINAL ANCILLARY ONLY
Bacterial Vaginitis (gardnerella): NEGATIVE
Candida Glabrata: NEGATIVE
Candida Vaginitis: POSITIVE — AB
Chlamydia: NEGATIVE
Comment: NEGATIVE
Comment: NEGATIVE
Comment: NEGATIVE
Comment: NEGATIVE
Comment: NEGATIVE
Comment: NORMAL
Neisseria Gonorrhea: NEGATIVE
Trichomonas: NEGATIVE

## 2023-07-03 ENCOUNTER — Other Ambulatory Visit: Payer: Self-pay | Admitting: Adult Health

## 2023-07-03 MED ORDER — FLUCONAZOLE 150 MG PO TABS: ORAL_TABLET | ORAL | 1 refills | Status: DC

## 2023-07-03 NOTE — Progress Notes (Signed)
+  yeast on vaginal swab, will rx diflucan  ?

## 2023-07-13 ENCOUNTER — Encounter: Payer: Medicaid Other | Admitting: Obstetrics & Gynecology

## 2023-07-26 ENCOUNTER — Emergency Department (HOSPITAL_COMMUNITY)
Admission: EM | Admit: 2023-07-26 | Discharge: 2023-07-26 | Disposition: A | Discharge status: Home / Self Care | Payer: Medicaid Other | Attending: Emergency Medicine | Admitting: Emergency Medicine

## 2023-07-26 ENCOUNTER — Other Ambulatory Visit: Payer: Self-pay

## 2023-07-26 ENCOUNTER — Encounter (HOSPITAL_COMMUNITY): Payer: Self-pay

## 2023-07-26 DIAGNOSIS — F32A Depression, unspecified: Secondary | ICD-10-CM | POA: Insufficient documentation

## 2023-07-26 DIAGNOSIS — R4689 Other symptoms and signs involving appearance and behavior: Secondary | ICD-10-CM | POA: Diagnosis present

## 2023-07-26 NOTE — ED Provider Notes (Signed)
[place] EMERGENCY DEPARTMENT AT [facility] Provider Note   CSN: [id number] Arrival date & time: 07/26/23  1314     History  Chief Complaint  Patient presents with   Mental Health Problem    Brelyn KANON [name] is a 22 y.o. female.  22 year old female presents today for concern of worsening depression and.  She states this has been building for years without significant point today.  She denies any SI, HI, or AVH.  She states in 2015 she was hospitalized in a psychiatric hospital and was started on medications which she stopped a few days after that admission.  Since then she has not been on any medications from a psych standpoint.  She states she does have a decent support system at home.  Comes in to get assistance.  Does endorse appetite change.  The history is provided by the patient. No language interpreter was used.       Home Medications Prior to Admission medications   Medication Sig Start Date End Date Taking? Authorizing Provider  fluconazole (DIFLUCAN) 150 MG tablet Take 1 now and 1 in 3 days if needed 07/03/23   Adline Potter, NP  famotidine (PEPCID) 20 MG tablet Take 1 tablet (20 mg total) by mouth daily. Patient not taking: Reported on 06/27/2023 05/05/23   Gailen Shelter, PA  ibuprofen (ADVIL) 200 MG tablet Take 800 mg by mouth daily as needed for moderate pain. Patient not taking: Reported on 06/27/2023    [provider]  Prenatal Vit-Fe Phos-FA-Omega (VITAFOL GUMMIES) 3.33-0.333-34.8 MG CHEW Chew 3 tablets by mouth daily. Patient not taking: Reported on 05/04/2023 03/27/23   Lennart Pall, MD  promethazine (PHENERGAN) 25 MG tablet Take 1 tablet (25 mg total) by mouth every 6 (six) hours as needed for nausea or vomiting. Patient not taking: Reported on 05/04/2023 03/27/23   Lennart Pall, MD      Allergies    Cats claw (uncaria tomentosa)    Review of Systems   Review of Systems  Constitutional:  Positive for appetite change.  Negative for chills and fever.  Psychiatric/Behavioral:  Negative for self-injury, sleep disturbance and suicidal ideas.   All other systems reviewed and are negative.   Physical Exam Updated Vital Signs BP 130/87   Pulse 95   Temp 98.9 F (37.2 C) (Oral)   Resp 18   Ht 5\' 3"  (1.6 m)   Wt 53.1 kg   LMP 06/18/2023   SpO2 100%   BMI 20.74 kg/m  Physical Exam Vitals and nursing note reviewed.  Constitutional:      General: She is not in acute distress.    Appearance: Normal appearance. She is not ill-appearing.  HENT:     Head: Normocephalic and atraumatic.     Nose: Nose normal.  Eyes:     Conjunctiva/sclera: Conjunctivae normal.  Cardiovascular:     Rate and Rhythm: Normal rate.  Pulmonary:     Effort: Pulmonary effort is normal. No respiratory distress.  Musculoskeletal:        General: No deformity.  Skin:    Findings: No rash.  Neurological:     General: No focal deficit present.     Mental Status: She is alert.  Psychiatric:        Attention and Perception: Attention normal.        Mood and Affect: Mood is depressed.        Speech: Speech normal.        Behavior:  Behavior normal. Behavior is cooperative.        Thought Content: Thought content normal. Thought content does not include homicidal or suicidal ideation.     ED Results / Procedures / Treatments   Labs (all labs ordered are listed, but only abnormal results are displayed) Labs Reviewed  ACETAMINOPHEN LEVEL  CBC WITH DIFFERENTIAL/PLATELET  COMPREHENSIVE METABOLIC PANEL  ETHANOL  RAPID URINE DRUG SCREEN, HOSP PERFORMED  SALICYLATE LEVEL  HCG, SERUM, QUALITATIVE    EKG None  Radiology No results found.  Procedures Procedures    Medications Ordered in ED Medications - No data to display  ED Course/ Medical Decision Making/ A&P                                 Medical Decision Making Amount and/or Complexity of Data Reviewed Labs: ordered.   22 year old female presents for  concern of worsening anxiety and depression.  Not currently on any medications.  No SI, HI, or AVH.  Blood work she does not seem to be a harm to herself or anyone else at this time.  She is here voluntarily.  Prior to workup being complete eloped.   Final Clinical Impression(s) / ED Diagnoses Final diagnoses:  Depression, unspecified depression type    Rx / DC Orders ED Discharge Orders     None         Marita Kansas, PA-C 07/26/23 1509    Linwood Dibbles, MD 07/27/23 [phone]

## 2023-07-26 NOTE — ED Triage Notes (Signed)
Patient reports increased anxiety and anger starting about a week ago. Reports stressor at that time. Denies SI/HI. Patient has hx of anxiety and depression. Does not take medication for this.

## 2023-07-26 NOTE — ED Notes (Addendum)
Patient walked out. PA made aware. Patient refused to sign AMA paperwork.

## 2023-08-08 ENCOUNTER — Ambulatory Visit: Payer: Medicaid Other | Admitting: Family
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
## 2023-08-25 ENCOUNTER — Ambulatory Visit: Payer: Medicaid Other | Admitting: Family

## 2023-09-24 ENCOUNTER — Encounter (HOSPITAL_COMMUNITY): Payer: Self-pay | Admitting: *Deleted

## 2023-09-24 ENCOUNTER — Emergency Department (HOSPITAL_COMMUNITY)
Admission: EM | Admit: 2023-09-24 | Discharge: 2023-09-25 | Disposition: A | Discharge status: Home / Self Care | Payer: Medicaid Other | Attending: Emergency Medicine | Admitting: Emergency Medicine

## 2023-09-24 ENCOUNTER — Other Ambulatory Visit: Payer: Self-pay

## 2023-09-24 DIAGNOSIS — R112 Nausea with vomiting, unspecified: Secondary | ICD-10-CM | POA: Diagnosis present

## 2023-09-24 LAB — CBC
HCT: 42.2 % (ref 36.0–46.0)
Hemoglobin: 13.8 g/dL (ref 12.0–15.0)
MCH: 28.9 pg (ref 26.0–34.0)
MCHC: 32.7 g/dL (ref 30.0–36.0)
MCV: 88.5 fL (ref 80.0–100.0)
Platelets: 294 10*3/uL (ref 150–400)
RBC: 4.77 MIL/uL (ref 3.87–5.11)
RDW: 14.8 % (ref 11.5–15.5)
WBC: 16.1 10*3/uL — ABNORMAL HIGH (ref 4.0–10.5)
nRBC: 0 % (ref 0.0–0.2)

## 2023-09-24 LAB — COMPREHENSIVE METABOLIC PANEL
ALT: 18 U/L (ref 0–44)
AST: 26 U/L (ref 15–41)
Albumin: 4.2 g/dL (ref 3.5–5.0)
Alkaline Phosphatase: 72 U/L (ref 38–126)
Anion gap: 12 (ref 5–15)
BUN: 7 mg/dL (ref 6–20)
CO2: 20 mmol/L — ABNORMAL LOW (ref 22–32)
Calcium: 9.4 mg/dL (ref 8.9–10.3)
Chloride: 106 mmol/L (ref 98–111)
Creatinine, Ser: 0.62 mg/dL (ref 0.44–1.00)
GFR, Estimated: >60 mL/min (ref >60)
Glucose, Bld: 131 mg/dL — ABNORMAL HIGH (ref 70–99)
Potassium: 3.8 mmol/L (ref 3.5–5.1)
Sodium: 138 mmol/L (ref 135–145)
Total Bilirubin: 1.2 mg/dL (ref 0.3–1.2)
Total Protein: 7.7 g/dL (ref 6.5–8.1)

## 2023-09-24 LAB — ETHANOL: Alcohol, Ethyl (B): <10 mg/dL (ref <10)

## 2023-09-24 LAB — HCG, SERUM, QUALITATIVE: Preg, Serum: NEGATIVE

## 2023-09-24 LAB — LIPASE, BLOOD: Lipase: 37 U/L (ref 11–51)

## 2023-09-24 MED ORDER — ONDANSETRON 4 MG PO TBDP: 4.0000 mg | ORAL_TABLET | Freq: Three times a day (TID) | ORAL | 0 refills | Status: DC | PRN

## 2023-09-24 MED ORDER — DIPHENHYDRAMINE HCL 50 MG/ML IJ SOLN
50.0000 mg | Freq: Once | INTRAMUSCULAR | Status: AC
  Administered 2023-09-24: 50 mg via INTRAVENOUS
  Filled 2023-09-24: qty 1

## 2023-09-24 MED ORDER — KETOROLAC TROMETHAMINE 15 MG/ML IJ SOLN
15.0000 mg | Freq: Once | INTRAMUSCULAR | Status: AC
  Administered 2023-09-24: 15 mg via INTRAVENOUS
  Filled 2023-09-24: qty 1

## 2023-09-24 MED ORDER — SODIUM CHLORIDE 0.9 % IV BOLUS
1000.0000 mL | Freq: Once | INTRAVENOUS | Status: AC
  Administered 2023-09-24: 1000 mL via INTRAVENOUS

## 2023-09-24 MED ORDER — LORAZEPAM 2 MG/ML IJ SOLN
0.5000 mg | Freq: Once | INTRAMUSCULAR | Status: AC
  Administered 2023-09-24: 0.5 mg via INTRAVENOUS
  Filled 2023-09-24: qty 1

## 2023-09-24 MED ORDER — PROCHLORPERAZINE 25 MG RE SUPP: 25.0000 mg | Freq: Two times a day (BID) | RECTAL | 0 refills | Status: AC | PRN

## 2023-09-24 MED ORDER — METOCLOPRAMIDE HCL 5 MG/ML IJ SOLN
10.0000 mg | Freq: Once | INTRAMUSCULAR | Status: AC
  Administered 2023-09-24: 10 mg via INTRAVENOUS
  Filled 2023-09-24: qty 2

## 2023-09-24 MED ORDER — ONDANSETRON HCL 4 MG PO TABS: 4.0000 mg | ORAL_TABLET | Freq: Four times a day (QID) | ORAL | 0 refills | Status: DC

## 2023-09-24 MED ORDER — ONDANSETRON HCL 4 MG/2ML IJ SOLN
4.0000 mg | Freq: Once | INTRAMUSCULAR | Status: AC
  Administered 2023-09-24: 4 mg via INTRAVENOUS
  Filled 2023-09-24: qty 2

## 2023-09-24 NOTE — Plan of Care (Signed)
Evelyn [name]:[id number] DOB: 08/30/2001 DOA: 09/24/2023     PCP: Pcp, No   Outpatient Specialists: * NONE CARDS: * Dr. None  NEphrology: *  Dr. No care team member to display  NEurology *   Dr. Pulmonary *  Dr.  Oncology * Dr.No care team member to display  GI* Dr.  Deboraha Sprang, [facility]) No care team member to display Urology Dr. *  Patient arrived to ER on 09/24/23 at 1852 Referred by Attending Wynetta Fines, MD   Patient coming from:    home Lives alone,   *** With family From facility ***    Chief Complaint: *** Chief Complaint  Patient presents with   Emesis    HPI: Evelyn [name] is a 22 y.o. female with medical history significant of ***     Presented with   * Patient with history of gastritis has been having nausea vomiting since this morning try to take her medications but does not seem to help last marijuana use was yesterday last alcohol  use was 3 days ago Patient got a dose of Ativan and became bradycardic down to 36 diaphoretic's and clammy Usually gets nausea her periods up-to-date worse than normal no associate abdominal pain no fever no chills no bloody emesis no change in bowel movements      Denies significant ETOH intake *** Does not smoke*** but interested in quitting***  Lab Results  Component Value Date   SARSCOV2NAA NEGATIVE 10/24/2020   SARSCOV2NAA Detected (A) 11/29/2019        Regarding pertinent Chronic problems: ***  ****Hyperlipidemia - *on statins {statin:315258}  Lipid Panel  No results found for: "CHOL", "TRIG", "HDL", "CHOLHDL", "VLDL", "LDLCALC", "LDLDIRECT", "LABVLDL"  ***HTN on   ***chronic CHF diastolic/systolic/ combined - last echo*** No results found for this or any previous visit (from the past 81191 hour(s)).  *** CAD  - On Aspirin, statin, betablocker, Plavix                 - *followed by cardiology                - last cardiac cath       ***DM 2 - No results found for: "HGBA1C" ****on insulin,  PO meds only, diet controlled  ***Hypothyroidism:  No results found for: "TSH", "T3TOTAL", "T4TOTAL", "THYROIDAB" on synthroid  *** Morbid obesity-   BMI Readings from Last 1 Encounters:  09/24/23 19.84 kg/m     *** Asthma -well *** controlled on home inhalers/ nebs                     *** COPD - not **followed by pulmonology *** not  on baseline oxygen  *L,    *** OSA -on nocturnal oxygen, *CPAP, *noncompliant with CPAP  *** Hx of CVA - *with/out residual deficits on Aspirin 81 mg, 325, Plavix  ***A. Fib -   atrial fibrillation CHA2DS2 vas score **** CHA2DS2/VAS Stroke Risk Points      N/A >= 2 Points: High Risk  1 to 1.99 Points: Medium Risk  0 Points: Low Risk    Last Change: N/A      This score determines the patient's risk of having a stroke if the  patient has atrial fibrillation.      This score is not applicable to this patient. Components are not  calculated.     current  on anticoagulation with ****Coumadin  ***Xarelto,* Eliquis,  *** Not on anticoagulation secondary  to Risk of Falls, *** recurrent bleeding         -  Rate control:  Currently controlled with ***Toprolol,  *Metoprolol,* Diltiazem, *Coreg          - Rhythm control: *** amiodarone, *flecainide  ***Hx of DVT/PE on - anticoagulation with ****Coumadin  ***Xarelto,* Eliquis,     ***CKD stage III*-   baseline Cr **** Estimated Creatinine Clearance: 90 mL/min (by C-G formula based on SCr of 0.62 mg/dL).  Lab Results  Component Value Date   CREATININE 0.62 09/24/2023   CREATININE 0.74 05/04/2023   CREATININE 1.04 (H) 07/22/2018   Lab Results  Component Value Date   NA 138 09/24/2023   CL 106 09/24/2023   K 3.8 09/24/2023   CO2 20 (L) 09/24/2023   BUN 7 09/24/2023   CREATININE 0.62 09/24/2023   GFRNONAA >60 09/24/2023   CALCIUM 9.4 09/24/2023   ALBUMIN 4.2 09/24/2023   GLUCOSE 131 (H) 09/24/2023    **** Liver disease Computed MELD 3.0 unavailable. One or more values for this score  either were not found within the given timeframe or did not fit some other criterion. Computed MELD-Na unavailable. One or more values for this score either were not found within the given timeframe or did not fit some other criterion.  Hepatic Function Panel     Component Value Date/Time   PROT 7.7 09/24/2023 1942   ALBUMIN 4.2 09/24/2023 1942   AST 26 09/24/2023 1942   ALT 18 09/24/2023 1942   ALKPHOS 72 09/24/2023 1942   BILITOT 1.2 09/24/2023 1942   No results found for requested labs within last 1095 days.  ***BPH - on Flomax, Proscar    *** Dementia - on Aricept** Nemenda  *** Chronic anemia - baseline hg Hemoglobin & Hematocrit  Recent Labs    04/02/23 0949 05/04/23 2338 09/24/23 1942  HGB 13.5 13.1 13.8   Iron/TIBC/Ferritin/ %Sat No results found for: "IRON", "TIBC", "FERRITIN", "IRONPCTSAT"   Seizure DO - las seizure *** currently on     Cancer:     While in ER:         Lab Orders         Lipase, blood         Comprehensive metabolic panel         CBC         Urinalysis, Routine w reflex microscopic -Urine, Clean Catch         hCG, serum, qualitative         Ethanol         Urine rapid drug screen (hosp performed)      CT HEAD *** NON acute   MRI brain  ***no acute CVA  CXR - ***NON acute  CTabd/pelvis - ***nonacute  CTA chest - ***nonacute, no PE, * no evidence of infiltrate  Following Medications were ordered in ER: Medications  sodium chloride 0.9 % bolus 1,000 mL (0 mLs Intravenous Stopped 09/24/23 2217)  ondansetron (ZOFRAN) injection 4 mg (4 mg Intravenous Given 09/24/23 1951)  ketorolac (TORADOL) 15 MG/ML injection 15 mg (15 mg Intravenous Given 09/24/23 1952)  LORazepam (ATIVAN) injection 0.5 mg (0.5 mg Intravenous Given 09/24/23 1954)  metoCLOPramide (REGLAN) injection 10 mg (10 mg Intravenous Given 09/24/23 2119)  diphenhydrAMINE (BENADRYL) injection 50 mg (50 mg Intravenous Given 09/24/23 2119)     _______________________________________________________ ER Provider Called:       DrMarland Kitchen  They Recommend admit to medicine *** Will see in AM  ***SEEN in ER  ED Triage Vitals  Encounter Vitals Group     BP 09/24/23 1920 (!) 141/101     Systolic BP Percentile --      Diastolic BP Percentile --      Pulse Rate 09/24/23 1920 100     Resp 09/24/23 1920 19     Temp 09/24/23 1920 98 F (36.7 C)     Temp Source 09/24/23 1920 Oral     SpO2 09/24/23 1920 100 %     Weight 09/24/23 1920 112 lb (50.8 kg)     Height 09/24/23 1920 5\' 3"  (1.6 m)     Head Circumference --      Peak Flow --      Pain Score 09/24/23 1924 10     Pain Loc --      Pain Education --      Exclude from Growth Chart --   ZOXW(96)@     _________________________________________ Significant initial  Findings: Abnormal Labs Reviewed  COMPREHENSIVE METABOLIC PANEL - Abnormal; Notable for the following components:      Result Value   CO2 20 (*)    Glucose, Bld 131 (*)    All other components within normal limits  CBC - Abnormal; Notable for the following components:   WBC 16.1 (*)    All other components within normal limits      _________________________ Troponin ***ordered Cardiac Panel (last 3 results) No results for input(s): "CKTOTAL", "CKMB", "TROPONINIHS", "RELINDX" in the last 72 hours.   ECG: Ordered Personally reviewed and interpreted by me showing: HR : *** Rhythm: *NSR, Sinus tachycardia * A.fib. W RVR, RBBB, LBBB, Paced Ischemic changes*nonspecific changes, no evidence of ischemic changes QTC*  BNP (last 3 results) No results for input(s): "BNP" in the last 8760 hours.   COVID-19 Labs  No results for input(s): "DDIMER", "FERRITIN", "LDH", "CRP" in the last 72 hours.  Lab Results  Component Value Date   SARSCOV2NAA NEGATIVE 10/24/2020   SARSCOV2NAA Detected (A) 11/29/2019       ____________________ This patient meets SIRS Criteria and may be septic. SIRS = Systemic Inflammatory  Response Syndrome  Order a lactic acid level if needed AND/OR Initiate the sepsis protocol with the attached order set OR Click "Treating Associated Infection or Illness" if the patient is being treated for an infection that is a known cause of these abnormalities     The recent clinical data is shown below. Vitals:   09/24/23 2045 09/24/23 2100 09/24/23 2145 09/24/23 2200  BP: 107/74 121/75 106/63 117/86  Pulse: (!) 55 61 74 100  Resp: 12 13 17 16   Temp:      TempSrc:      SpO2: 100% 100% 100% 100%  Weight:      Height:            WBC     Component Value Date/Time   WBC 16.1 (H) 09/24/2023 1942   LYMPHSABS 2.9 07/07/2018 1819   MONOABS 0.6 07/07/2018 1819   EOSABS 0.1 07/07/2018 1819   BASOSABS 0.0 07/07/2018 1819        Lactic Acid, Venous No results found for: "LATICACIDVEN"    Lactic Acid, Venous No results found for: "LATICACIDVEN"  Procalcitonin *** Ordered      UA *** no evidence of UTI  ***Pending ***not ordered   Urine analysis:    Component Value Date/Time   COLORURINE YELLOW 05/04/2023 2352   APPEARANCEUR CLEAR 05/04/2023 2352   LABSPEC 1.010 05/04/2023 2352   PHURINE 5.0 05/04/2023  2352   GLUCOSEU NEGATIVE 05/04/2023 2352   HGBUR NEGATIVE 05/04/2023 2352   BILIRUBINUR NEGATIVE 05/04/2023 2352   BILIRUBINUR negative 02/22/2023 2006   KETONESUR 20 (A) 05/04/2023 2352   PROTEINUR NEGATIVE 05/04/2023 2352   UROBILINOGEN 1.0 02/22/2023 2006   UROBILINOGEN 1.0 09/11/2022 1056   NITRITE NEGATIVE 05/04/2023 2352   LEUKOCYTESUR Negative 06/27/2023 1627   LEUKOCYTESUR NEGATIVE 05/04/2023 2352    Results for orders placed or performed during the hospital encounter of 04/02/23  Wet prep, genital     Status: Abnormal   Collection Time: 04/02/23  9:39 AM  Result Value Ref Range Status   Yeast Wet Prep HPF POC NONE SEEN NONE SEEN Final   Trich, Wet Prep NONE SEEN NONE SEEN Final   Clue Cells Wet Prep HPF POC PRESENT (A) NONE SEEN Final   WBC,  Wet Prep HPF POC <10 <10 Final   Sperm NONE SEEN  Final    Comment: Performed at [facility], [address] [place], [place] [address]    ABX started Antibiotics Given (last 72 hours)     None       No results found for the last 90 days.     ________________________________________________________________  Arterial ***Venous  Blood Gas result:  pH *** pCO2 ***; pO2 ***;     %O2 Sat ***.  ABG No results found for: "PHART", "PCO2ART", "PO2ART", "HCO3", "TCO2", "ACIDBASEDEF", "O2SAT"     __________________________________________________________ Recent Labs  Lab 09/24/23 1942  NA 138  K 3.8  CO2 20*  GLUCOSE 131*  BUN 7  CREATININE 0.62  CALCIUM 9.4    Cr  * stable,  Up from baseline see below Lab Results  Component Value Date   CREATININE 0.62 09/24/2023   CREATININE 0.74 05/04/2023   CREATININE 1.04 (H) 07/22/2018    Recent Labs  Lab 09/24/23 1942  AST 26  ALT 18  ALKPHOS 72  BILITOT 1.2  PROT 7.7  ALBUMIN 4.2   Lab Results  Component Value Date   CALCIUM 9.4 09/24/2023          Plt: Lab Results  Component Value Date   PLT 294 09/24/2023         Recent Labs  Lab 09/24/23 1942  WBC 16.1*  HGB 13.8  HCT 42.2  MCV 88.5  PLT 294    HG/HCT * stable,  Down *Up from baseline see below    Component Value Date/Time   HGB 13.8 09/24/2023 1942   HCT 42.2 09/24/2023 1942   MCV 88.5 09/24/2023 1942      Recent Labs  Lab 09/24/23 1942  LIPASE 37   No results for input(s): "AMMONIA" in the last 168 hours.    .lab  _______________________________________________ Hospitalist was called for admission for *** Nausea and vomiting, unspecified vomiting type ***    The following Work up has been ordered so far:  Orders Placed This Encounter  Procedures   Lipase, blood   Comprehensive metabolic panel   CBC   Urinalysis, Routine w reflex microscopic -Urine, Clean Catch   hCG, serum, qualitative   Ethanol   Urine  rapid drug screen (hosp performed)   Diet NPO time specified   ED Cardiac monitoring   Consult to hospitalist   ED EKG   EKG 12-Lead   EKG 12-Lead   EKG 12-Lead   EKG 12-Lead   Saline lock IV     OTHER Significant initial  Findings:  labs showing:  DM  labs:  HbA1C: No results for input(s): "HGBA1C" in the last 8760 hours.     CBG (last 3)  No results for input(s): "GLUCAP" in the last 72 hours.        Cultures:    Component Value Date/Time   SDES URINE, CLEAN CATCH 02/22/2023 2004   SPECREQUEST NONE 02/22/2023 2004   CULT (A) 02/22/2023 2004    >=100,000 COLONIES/mL GROUP B STREP(S.AGALACTIAE)ISOLATED TESTING AGAINST S. AGALACTIAE NOT ROUTINELY PERFORMED DUE TO PREDICTABILITY OF AMP/PEN/VAN SUSCEPTIBILITY. 80,000 COLONIES/mL VIRIDANS STREPTOCOCCUS Standardized susceptibility testing for this organism is not available. Performed at [facility], [address], [place], [place] [address]    REPTSTATUS 02/25/2023 FINAL 02/22/2023 2004     Radiological Exams on Admission: No results found. _______________________________________________________________________________________________________ Latest  Blood pressure 117/86, pulse 100, temperature 98 F (36.7 C), temperature source Oral, resp. rate 16, height 5\' 3"  (1.6 m), weight 50.8 kg, last menstrual period 09/24/2023, SpO2 100%.   Vitals  labs and radiology finding personally reviewed  Review of Systems:    Pertinent positives include: ***  Constitutional:  No weight loss, night sweats, Fevers, chills, fatigue, weight loss  HEENT:  No headaches, Difficulty swallowing,Tooth/dental problems,Sore throat,  No sneezing, itching, ear ache, nasal congestion, post nasal drip,  Cardio-vascular:  No chest pain, Orthopnea, PND, anasarca, dizziness, palpitations.no Bilateral lower extremity swelling  GI:  No heartburn, indigestion, abdominal pain, nausea, vomiting, diarrhea, change in bowel habits, loss of  appetite, melena, blood in stool, hematemesis Resp:  no shortness of breath at rest. No dyspnea on exertion, No excess mucus, no productive cough, No non-productive cough, No coughing up of blood.No change in color of mucus.No wheezing. Skin:  no rash or lesions. No jaundice GU:  no dysuria, change in color of urine, no urgency or frequency. No straining to urinate.  No flank pain.  Musculoskeletal:  No joint pain or no joint swelling. No decreased range of motion. No back pain.  Psych:  No change in mood or affect. No depression or anxiety. No memory loss.  Neuro: no localizing neurological complaints, no tingling, no weakness, no double vision, no gait abnormality, no slurred speech, no confusion  All systems reviewed and apart from HOPI all are negative _______________________________________________________________________________________________ Past Medical History:   Past Medical History:  Diagnosis Date   Depression    UTI (urinary tract infection)       Past Surgical History:  Procedure Laterality Date   NO PAST SURGERIES     WISDOM TOOTH EXTRACTION      Social History:  Ambulatory *** independently cane, walker  wheelchair bound, bed bound     reports that she has quit smoking. Her smoking use included e-cigarettes. She has been exposed to tobacco smoke. She has never used smokeless tobacco. She reports current alcohol use. She reports current drug use. Frequency: 7.00 times per week. Drug: Marijuana.     Family History: *** Family History  Problem Relation Age of Onset   Diabetes Father    Alcohol abuse Father    Diabetes Mother    ______________________________________________________________________________________________ Allergies: Allergies  Allergen Reactions   Cats Claw (Uncaria Tomentosa) Itching     Prior to Admission medications   Medication Sig Start Date End Date Taking? Authorizing Provider  fluconazole (DIFLUCAN) 150 MG tablet Take 1  now and 1 in 3 days if needed 07/03/23   Cyril Mourning A, NP  ondansetron (ZOFRAN) 4 MG tablet Take 1 tablet (4 mg total) by mouth every  6 (six) hours. 09/24/23  Yes Wynetta Fines, MD  prochlorperazine (COMPAZINE) 25 MG suppository Place 1 suppository (25 mg total) rectally every 12 (twelve) hours as needed for nausea or vomiting. 09/24/23  Yes Wynetta Fines, MD  famotidine (PEPCID) 20 MG tablet Take 1 tablet (20 mg total) by mouth daily. Patient not taking: Reported on 06/27/2023 05/05/23   Gailen Shelter, PA  ibuprofen (ADVIL) 200 MG tablet Take 800 mg by mouth daily as needed for moderate pain. Patient not taking: Reported on 06/27/2023    [provider]  Prenatal Vit-Fe Phos-FA-Omega (VITAFOL GUMMIES) 3.33-0.333-34.8 MG CHEW Chew 3 tablets by mouth daily. Patient not taking: Reported on 05/04/2023 03/27/23   Lennart Pall, MD  promethazine (PHENERGAN) 25 MG tablet Take 1 tablet (25 mg total) by mouth every 6 (six) hours as needed for nausea or vomiting. Patient not taking: Reported on 05/04/2023 03/27/23   Lennart Pall, MD    ___________________________________________________________________________________________________ Physical Exam:    09/24/2023   10:00 PM 09/24/2023    9:45 PM 09/24/2023    9:00 PM  Vitals with BMI  Systolic 117 106 213  Diastolic 86 63 75  Pulse 100 74 61     1. General:  in No ***Acute distress***increased work of breathing ***complaining of severe pain****agitated * Chronically ill *well *cachectic *toxic acutely ill -appearing 2. Psychological: Alert and *** Oriented 3. Head/ENT:   Moist *** Dry Mucous Membranes                          Head Non traumatic, neck supple                          Normal *** Poor Dentition 4. SKIN: normal *** decreased Skin turgor,  Skin clean Dry and intact no rash    5. Heart: Regular rate and rhythm no*** Murmur, no Rub or gallop 6. Lungs: ***Clear to auscultation bilaterally, no wheezes or crackles    7. Abdomen: Soft, ***non-tender, Non distended *** obese ***bowel sounds present 8. Lower extremities: no clubbing, cyanosis, no ***edema 9. Neurologically Grossly intact, moving all 4 extremities equally *** strength 5 out of 5 in all 4 extremities cranial nerves II through XII intact 10. MSK: Normal range of motion    Chart has been reviewed  ______________________________________________________________________________________________  Assessment/Plan  ***  Admitted for *** Nausea and vomiting, unspecified vomiting type ***    Present on Admission: **None**     No problem-specific Assessment & Plan notes found for this encounter.    Other plan as per orders.  DVT prophylaxis:  SCD *** Lovenox       Code Status:    Code Status: Prior FULL CODE *** DNR/DNI ***comfort care as per patient ***family  I had personally discussed CODE STATUS with patient and family*  ACP *** none has been reviewed ***   Family Communication:   Family not at  Bedside  plan of care was discussed on the phone with *** Son, Daughter, Wife, Husband, Sister, Brother , father, mother  Diet  Diet Orders (From admission, onward)     Start     Ordered   09/24/23 1926  Diet NPO time specified  Diet effective now        09/24/23 1925            Disposition Plan:   *** likely will need placement for rehabilitation  Back to current facility when stable                            To home once workup is complete and patient is stable  ***Following barriers for discharge:                             Chest pain *** Stroke *** work up is complete                            Electrolytes corrected                               Anemia corrected h/H stable                             Pain controlled with PO medications                               Afebrile, white count improving able to transition to PO antibiotics                             Will need to be able to  tolerate PO                            Will likely need home health, home O2, set up                           Will need consultants to evaluate patient prior to discharge       Consult Orders  (From admission, onward)           Start     Ordered   09/24/23 2252  Consult to hospitalist  Once       Provider:  (Not yet assigned)  Question Answer Comment  Place call to: [facility]   Reason for Consult Admit      09/24/23 2251                              ***Would benefit from PT/OT eval prior to DC  Ordered                   Swallow eval - SLP ordered                   Diabetes care coordinator                   Transition of care consulted                   Nutrition    consulted                  Wound care  consulted                   Palliative care    consulted                   Behavioral health  consulted  Consults called: ***     Admission status:  ED Disposition     ED Disposition  Admit   Condition  --   Comment  The patient appears reasonably stabilized for admission considering the current resources, flow, and capabilities available in the ED at this time, and I doubt any other [facility] requiring further screening and/or treatment in the ED prior to admission is  present.           Obs***  ***  inpatient     I Expect 2 midnight stay secondary to severity of patient's current illness need for inpatient interventions justified by the following: ***hemodynamic instability despite optimal treatment (tachycardia *hypotension * tachypnea *hypoxia, hypercapnia) * Severe lab/radiological/exam abnormalities including:     and extensive comorbidities including: *substance abuse  *Chronic pain *DM2  * CHF * CAD  * COPD/asthma *Morbid Obesity * CKD *dementia *liver disease *history of stroke with residual deficits *  malignancy, * sickle cell disease  History of amputation Chronic anticoagulation  That are currently  affecting medical management.   I expect  patient to be hospitalized for 2 midnights requiring inpatient medical care.  Patient is at high risk for adverse outcome (such as loss of life or disability) if not treated.  Indication for inpatient stay as follows:  Severe change from baseline regarding mental status Hemodynamic instability despite maximal medical therapy,  ongoing suicidal ideations,  severe pain requiring acute inpatient management,  inability to maintain oral hydration   persistent chest pain despite medical management Need for operative/procedural  intervention New or worsening hypoxia   Need for IV antibiotics, IV fluids, IV rate controling medications, IV antihypertensives, IV pain medications, IV anticoagulation, need for biPAP    Level of care   *** tele  For 12H 24H     medical floor       progressive     stepdown   tele indefinitely please discontinue once patient no longer qualifies COVID-19 Labs    Lab Results  Component Value Date   SARSCOV2NAA NEGATIVE 10/24/2020     Precautions: admitted as *** Covid Negative  ***asymptomatic screening protocol****PUI *** covid positive No active isolations ***If Covid PCR is negative  - please DC precautions - would need additional investigation given very high risk for false native test result    Critical***  Patient is critically ill due to  hemodynamic instability * respiratory failure *severe sepsis* ongoing chest pain*  They are at high risk for life/limb threatening clinical deterioration requiring frequent reassessment and modifications of care.  Services provided include examination of the patient, review of relevant ancillary tests, prescription of lifesaving therapies, review of medications and prophylactic therapy.  Total critical care time excluding separately billable procedures: 60*  Minutes.    Jozef Eisenbeis 09/24/2023, 11:05 PM ***  [facility]     after 2 AM please page floor  coverage PA If 7AM-7PM, please contact the day team taking care of the patient using [email or web address]

## 2023-09-24 NOTE — Subjective & Objective (Signed)
Patient with history of gastritis has been having nausea vomiting since this morning try to take her medications but does not seem to help last marijuana use was yesterday last alcohol  use was 3 days ago Patient got a dose of Ativan and became bradycardic down to 36 diaphoretic's and clammy Usually gets nausea her periods up-to-date worse than normal no associate abdominal pain no fever no chills no bloody emesis no change in bowel movements

## 2023-09-24 NOTE — ED Notes (Addendum)
After admin of ativan, pts' HR dropped to 36 and pt became clammy/ MD was made aware and at bedside/ EKG in progress/ pt is alert but still complaining of nausea and stomach cramps

## 2023-09-24 NOTE — Assessment & Plan Note (Signed)
Symptomatic management ?

## 2023-09-24 NOTE — ED Provider Notes (Signed)
[place] EMERGENCY DEPARTMENT AT [facility] Provider Note   CSN: [id number] Arrival date & time: 09/24/23  3244     History  Chief Complaint  Patient presents with   Emesis    Evelyn [name] is a 22 y.o. female.  22 year old female with prior medical history as detailed below presents for evaluation.  Patient reports onset of nausea and vomiting over the last 12 hours.  Patient reports routine development of nausea when her period starts.  Symptoms today are worse than her normal.  Patient is actively vomiting on initial evaluation.  She denies abdominal pain, fever, bloody emesis, change in bowel movement.  She reports occasional use of marijuana.  The history is provided by the patient and medical records.       Home Medications Prior to Admission medications   Medication Sig Start Date End Date Taking? Authorizing Provider  fluconazole (DIFLUCAN) 150 MG tablet Take 1 now and 1 in 3 days if needed 07/03/23   Adline Potter, NP  ondansetron (ZOFRAN) 4 MG tablet Take 1 tablet (4 mg total) by mouth every 6 (six) hours. 09/24/23  Yes Wynetta Fines, MD  prochlorperazine (COMPAZINE) 25 MG suppository Place 1 suppository (25 mg total) rectally every 12 (twelve) hours as needed for nausea or vomiting. 09/24/23  Yes Wynetta Fines, MD  famotidine (PEPCID) 20 MG tablet Take 1 tablet (20 mg total) by mouth daily. Patient not taking: Reported on 06/27/2023 05/05/23   Gailen Shelter, PA  ibuprofen (ADVIL) 200 MG tablet Take 800 mg by mouth daily as needed for moderate pain. Patient not taking: Reported on 06/27/2023    [provider]  Prenatal Vit-Fe Phos-FA-Omega (VITAFOL GUMMIES) 3.33-0.333-34.8 MG CHEW Chew 3 tablets by mouth daily. Patient not taking: Reported on 05/04/2023 03/27/23   Lennart Pall, MD  promethazine (PHENERGAN) 25 MG tablet Take 1 tablet (25 mg total) by mouth every 6 (six) hours as needed for nausea or vomiting. Patient not  taking: Reported on 05/04/2023 03/27/23   Lennart Pall, MD      Allergies    Cats claw (uncaria tomentosa)    Review of Systems   Review of Systems  All other systems reviewed and are negative.   Physical Exam Updated Vital Signs BP 117/86   Pulse 100   Temp 98 F (36.7 C) (Oral)   Resp 16   Ht 5\' 3"  (1.6 m)   Wt 50.8 kg   LMP 09/24/2023   SpO2 100%   BMI 19.84 kg/m  Physical Exam Vitals and nursing note reviewed.  Constitutional:      General: She is not in acute distress.    Appearance: She is well-developed.     Comments: Actively vomiting on initial evaluation  HENT:     Head: Normocephalic and atraumatic.  Eyes:     Conjunctiva/sclera: Conjunctivae normal.     Pupils: Pupils are equal, round, and reactive to light.  Cardiovascular:     Rate and Rhythm: Normal rate and regular rhythm.     Heart sounds: Normal heart sounds.  Pulmonary:     Effort: Pulmonary effort is normal. No respiratory distress.     Breath sounds: Normal breath sounds.  Abdominal:     General: There is no distension.     Palpations: Abdomen is soft.     Tenderness: There is no abdominal tenderness.  Musculoskeletal:        General: No deformity. Normal range of motion.  Cervical back: Normal range of motion and neck supple.  Skin:    General: Skin is warm and dry.  Neurological:     General: No focal deficit present.     Mental Status: She is alert and oriented to person, place, and time.     ED Results / Procedures / Treatments   Labs (all labs ordered are listed, but only abnormal results are displayed) Labs Reviewed  COMPREHENSIVE METABOLIC PANEL - Abnormal; Notable for the following components:      Result Value   CO2 20 (*)    Glucose, Bld 131 (*)    All other components within normal limits  CBC - Abnormal; Notable for the following components:   WBC 16.1 (*)    All other components within normal limits  LIPASE, BLOOD  HCG, SERUM, QUALITATIVE  ETHANOL   URINALYSIS, ROUTINE W REFLEX MICROSCOPIC  RAPID URINE DRUG SCREEN, HOSP PERFORMED    EKG EKG Interpretation Date/Time:  Sunday September 24 2023 20:11:37 EDT Ventricular Rate:  72 PR Interval:  121 QRS Duration:  75 QT Interval:  442 QTC Calculation: 423 R Axis:   117  Text Interpretation: Sinus or ectopic atrial rhythm Atrial premature complexes Right axis deviation Low voltage, extremity leads Nonspecific T abnormalities, lateral leads Confirmed by Kristine Royal [phone]) on 09/24/2023 8:53:40 PM  Radiology No results found.  Procedures Procedures    Medications Ordered in ED Medications  sodium chloride 0.9 % bolus 1,000 mL (0 mLs Intravenous Stopped 09/24/23 2217)  ondansetron (ZOFRAN) injection 4 mg (4 mg Intravenous Given 09/24/23 1951)  ketorolac (TORADOL) 15 MG/ML injection 15 mg (15 mg Intravenous Given 09/24/23 1952)  LORazepam (ATIVAN) injection 0.5 mg (0.5 mg Intravenous Given 09/24/23 1954)  metoCLOPramide (REGLAN) injection 10 mg (10 mg Intravenous Given 09/24/23 2119)  diphenhydrAMINE (BENADRYL) injection 50 mg (50 mg Intravenous Given 09/24/23 2119)    ED Course/ Medical Decision Making/ A&P                                 Medical Decision Making Amount and/or Complexity of Data Reviewed Labs: ordered.  Risk Prescription drug management.    Medical Screen Complete  This patient presented to the ED with complaint of nausea and vomiting.  This complaint involves an extensive number of treatment options. The initial differential diagnosis includes, but is not limited to, nausea and vomiting, metabolic abnormality, hyperemesis secondary to marijuana use, etc.  This presentation is: Acute, Self-Limited, Previously Undiagnosed, Uncertain Prognosis, Complicated, Systemic Symptoms, and Threat to Life/Bodily Function  Patient presents with nausea and vomiting.  Screening labs are of the whole are fairly unremarkable.  However, patient with continued nausea  and vomiting despite multiple rounds of multiple antiemetics.  Given continued vomiting we will arrange for observation overnight with the hospitalist service.  Additional history obtained: External records from outside sources obtained and reviewed including prior ED visits and prior Inpatient records.    Lab Tests:  I ordered and personally interpreted labs.  The pertinent results include: CBC, lipase, CMP, EtOH, hCG, urine tox   Cardiac Monitoring:  The patient was maintained on a cardiac monitor.  I personally viewed and interpreted the cardiac monitor which showed an underlying rhythm of: NSR   Medicines ordered:  I ordered medication including IV fluids, Benadryl, Reglan, Ativan, Toradol, Zofran for nausea and vomiting Reevaluation of the patient after these medicines showed that the patient: improved   Problem List /  ED Course:  Nausea and vomiting   Reevaluation:  After the interventions noted above, I reevaluated the patient and found that they have: improved   Disposition:  After consideration of the diagnostic results and the patients response to treatment, I feel that the patent would benefit from admission.          Final Clinical Impression(s) / ED Diagnoses Final diagnoses:  Nausea and vomiting, unspecified vomiting type    Rx / DC Orders ED Discharge Orders          Ordered    ondansetron (ZOFRAN) 4 MG tablet  Every 6 hours        09/24/23 2240    prochlorperazine (COMPAZINE) 25 MG suppository  Every 12 hours PRN        09/24/23 2240              Wynetta Fines, MD 09/24/23 2255

## 2023-09-24 NOTE — ED Notes (Signed)
Pt reports feeling better after vomiting/ HR WNL at this time

## 2023-09-24 NOTE — Discharge Instructions (Signed)
Return for any problem.  ?

## 2023-09-24 NOTE — ED Triage Notes (Addendum)
Pt reports that she has gastritis and she has had vomiting since this morning. Has taken her vomiting meds without relief. Last marijuana use yesterday, last alcohol 3 days ago.

## 2023-09-25 DIAGNOSIS — R112 Nausea with vomiting, unspecified: Secondary | ICD-10-CM

## 2023-09-25 NOTE — Plan of Care (Incomplete)
Evelyn [name]:[id number] DOB: Nov 24, 2001 DOA: 09/24/2023     PCP: Pcp, No   Outpatient Specialists:  NONE   Patient arrived to ER on 09/24/23 at 1852 Referred by Attending Wynetta Fines, MD   Patient coming from:    home Lives   With family      Chief Complaint:   Chief Complaint  Patient presents with  . Emesis    HPI: Evelyn [name] is a 22 y.o. female with medical history significant of recurrent nausea vomiting    Presented with   nausea vomiting Patient with history of gastritis has been having nausea vomiting since this morning try to take her medications but does not seem to help last marijuana use was yesterday last alcohol  use was 3 days ago Patient got a dose of Ativan and became bradycardic down to 36 diaphoretic's and clammy Usually gets nausea her periods up-to-date worse than normal no associate abdominal pain no fever no chills no bloody emesis no change in bowel movements   Patient states that she just wanted make sure she can tolerate water she seems to be tolerate water now Patient states she does not want to be admitted at this time Case discussed today with the ER who plans to discharge patient    Lab Results  Component Value Date   SARSCOV2NAA NEGATIVE 10/24/2020   SARSCOV2NAA Detected (A) 11/29/2019      While in ER:   Appears to be somewhat dehydrated Eventually nausea vomiting under control now able to tolerate p.o.    Lab Orders         Lipase, blood         Comprehensive metabolic panel         CBC         Urinalysis, Routine w reflex microscopic -Urine, Clean Catch         hCG, serum, qualitative         Ethanol         Urine rapid drug screen (hosp performed)        Following Medications were ordered in ER: Medications  sodium chloride 0.9 % bolus 1,000 mL (0 mLs Intravenous Stopped 09/24/23 2217)  ondansetron (ZOFRAN) injection 4 mg (4 mg Intravenous Given 09/24/23 1951)  ketorolac (TORADOL) 15 MG/ML injection  15 mg (15 mg Intravenous Given 09/24/23 1952)  LORazepam (ATIVAN) injection 0.5 mg (0.5 mg Intravenous Given 09/24/23 1954)  metoCLOPramide (REGLAN) injection 10 mg (10 mg Intravenous Given 09/24/23 2119)  diphenhydrAMINE (BENADRYL) injection 50 mg (50 mg Intravenous Given 09/24/23 2119)     ED Triage Vitals  Encounter Vitals Group     BP 09/24/23 1920 (!) 141/101     Systolic BP Percentile --      Diastolic BP Percentile --      Pulse Rate 09/24/23 1920 100     Resp 09/24/23 1920 19     Temp 09/24/23 1920 98 F (36.7 C)     Temp Source 09/24/23 1920 Oral     SpO2 09/24/23 1920 100 %     Weight 09/24/23 1920 112 lb (50.8 kg)     Height 09/24/23 1920 5\' 3"  (1.6 m)     Head Circumference --      Peak Flow --      Pain Score 09/24/23 1924 10     Pain Loc --      Pain Education --      Exclude from Growth Chart --  KZSW(10)@     _________________________________________ Significant initial  Findings: Abnormal Labs Reviewed  COMPREHENSIVE METABOLIC PANEL - Abnormal; Notable for the following components:      Result Value   CO2 20 (*)    Glucose, Bld 131 (*)    All other components within normal limits  CBC - Abnormal; Notable for the following components:   WBC 16.1 (*)    All other components within normal limits     ECG: Ordered Personally reviewed and interpreted by me showing: HR : 73  NSR  no evidence of ischemic changes QTC 423  BNP (last 3 results) No results for input(s): "BNP" in the last 8760 hours.   COVID-19 Labs  No results for input(s): "DDIMER", "FERRITIN", "LDH", "CRP" in the last 72 hours.  Lab Results  Component Value Date   SARSCOV2NAA NEGATIVE 10/24/2020   SARSCOV2NAA Detected (A) 11/29/2019     The recent clinical data is shown below. Vitals:   09/24/23 2045 09/24/23 2100 09/24/23 2145 09/24/23 2200  BP: 107/74 121/75 106/63 117/86  Pulse: (!) 55 61 74 100  Resp: 12 13 17 16   Temp:      TempSrc:      SpO2: 100% 100% 100% 100%  Weight:       Height:          WBC     Component Value Date/Time   WBC 16.1 (H) 09/24/2023 1942   LYMPHSABS 2.9 07/07/2018 1819   MONOABS 0.6 07/07/2018 1819   EOSABS 0.1 07/07/2018 1819   BASOSABS 0.0 07/07/2018 1819     Results for orders placed or performed during the hospital encounter of 04/02/23  Wet prep, genital     Status: Abnormal   Collection Time: 04/02/23  9:39 AM  Result Value Ref Range Status   Yeast Wet Prep HPF POC NONE SEEN NONE SEEN Final   Trich, Wet Prep NONE SEEN NONE SEEN Final   Clue Cells Wet Prep HPF POC PRESENT (A) NONE SEEN Final   WBC, Wet Prep HPF POC <10 <10 Final   Sperm NONE SEEN  Final    Comment: Performed at [facility], [address], [place], [place] [address]      __________________________________________________________ Recent Labs  Lab 09/24/23 1942  NA 138  K 3.8  CO2 20*  GLUCOSE 131*  BUN 7  CREATININE 0.62  CALCIUM 9.4    Cr  * stable,  Up from baseline see below Lab Results  Component Value Date   CREATININE 0.62 09/24/2023   CREATININE 0.74 05/04/2023   CREATININE 1.04 (H) 07/22/2018    Recent Labs  Lab 09/24/23 1942  AST 26  ALT 18  ALKPHOS 72  BILITOT 1.2  PROT 7.7  ALBUMIN 4.2   Lab Results  Component Value Date   CALCIUM 9.4 09/24/2023          Plt: Lab Results  Component Value Date   PLT 294 09/24/2023      Recent Labs  Lab 09/24/23 1942  WBC 16.1*  HGB 13.8  HCT 42.2  MCV 88.5  PLT 294    HG/HCT  stable,     Component Value Date/Time   HGB 13.8 09/24/2023 1942   HCT 42.2 09/24/2023 1942   MCV 88.5 09/24/2023 1942     Recent Labs  Lab 09/24/23 1942  LIPASE 37    _______________________________________________ Hospitalist was called for admission for   Nausea and vomiting, unspecified vomiting type symptoms improved and now patient would  like to be discharged to home    The following Work up has been ordered so far:  Orders Placed This Encounter  Procedures  . Lipase,  blood  . Comprehensive metabolic panel  . CBC  . Urinalysis, Routine w reflex microscopic -Urine, Clean Catch  . hCG, serum, qualitative  . Ethanol  . Urine rapid drug screen (hosp performed)  . Diet NPO time specified  . ED Cardiac monitoring  . Consult to hospitalist  . ED EKG  . EKG 12-Lead  . EKG 12-Lead  . EKG 12-Lead  . EKG 12-Lead  . Saline lock IV     OTHER Significant initial  Findings:  labs showing:     DM  labs:  HbA1C: No results for input(s): "HGBA1C" in the last 8760 hours.     CBG (last 3)  No results for input(s): "GLUCAP" in the last 72 hours.        Cultures:    Component Value Date/Time   SDES URINE, CLEAN CATCH 02/22/2023 2004   SPECREQUEST NONE 02/22/2023 2004   CULT (A) 02/22/2023 2004    >=100,000 COLONIES/mL GROUP B STREP(S.AGALACTIAE)ISOLATED TESTING AGAINST S. AGALACTIAE NOT ROUTINELY PERFORMED DUE TO PREDICTABILITY OF AMP/PEN/VAN SUSCEPTIBILITY. 80,000 COLONIES/mL VIRIDANS STREPTOCOCCUS Standardized susceptibility testing for this organism is not available. Performed at [facility], [address], [place], [place] [address]    REPTSTATUS 02/25/2023 FINAL 02/22/2023 2004     Radiological Exams on Admission: No results found. _______________________________________________________________________________________________________ Latest  Blood pressure 117/86, pulse 100, temperature 98 F (36.7 C), temperature source Oral, resp. rate 16, height 5\' 3"  (1.6 m), weight 50.8 kg, last menstrual period 09/24/2023, SpO2 100%.   Vitals  labs and radiology finding personally reviewed  Review of Systems:    Pertinent positives include:   , nausea, vomiting Constitutional:  No weight loss, night sweats, Fevers, chills, fatigue, weight loss  HEENT:  No headaches, Difficulty swallowing,Tooth/dental problems,Sore throat,  No sneezing, itching, ear ache, nasal congestion, post nasal drip,  Cardio-vascular:  No chest pain, Orthopnea,  PND, anasarca, dizziness, palpitations.no Bilateral lower extremity swelling  GI:  No heartburn, indigestion, abdominal pain, diarrhea, change in bowel habits, loss of appetite, melena, blood in stool, hematemesis Resp:  no shortness of breath at rest. No dyspnea on exertion, No excess mucus, no productive cough, No non-productive cough, No coughing up of blood.No change in color of mucus.No wheezing. Skin:  no rash or lesions. No jaundice GU:  no dysuria, change in color of urine, no urgency or frequency. No straining to urinate.  No flank pain.  Musculoskeletal:  No joint pain or no joint swelling. No decreased range of motion. No back pain.  Psych:  No change in mood or affect. No depression or anxiety. No memory loss.  Neuro: no localizing neurological complaints, no tingling, no weakness, no double vision, no gait abnormality, no slurred speech, no confusion  All systems reviewed and apart from HOPI all are negative _______________________________________________________________________________________________ Past Medical History:   Past Medical History:  Diagnosis Date  . Depression   . UTI (urinary tract infection)       Past Surgical History:  Procedure Laterality Date  . NO PAST SURGERIES    . WISDOM TOOTH EXTRACTION      Social History:  Ambulatory   independently      reports that she has quit smoking. Her smoking use included e-cigarettes. She has been exposed to tobacco smoke. She has never used smokeless tobacco. She reports current alcohol  use. She reports current drug use. Frequency: 7.00 times per week. Drug: Marijuana.     Family History:   Family History  Problem Relation Age of Onset  . Diabetes Father   . Alcohol abuse Father   . Diabetes Mother    ______________________________________________________________________________________________ Allergies: Allergies  Allergen Reactions  . Cats Claw (Uncaria Tomentosa) Itching     Prior to  Admission medications   Medication Sig Start Date End Date Taking? Authorizing Provider  fluconazole (DIFLUCAN) 150 MG tablet Take 1 now and 1 in 3 days if needed 07/03/23   Adline Potter, NP  ondansetron (ZOFRAN) 4 MG tablet Take 1 tablet (4 mg total) by mouth every 6 (six) hours. 09/24/23  Yes Wynetta Fines, MD  prochlorperazine (COMPAZINE) 25 MG suppository Place 1 suppository (25 mg total) rectally every 12 (twelve) hours as needed for nausea or vomiting. 09/24/23  Yes Wynetta Fines, MD  famotidine (PEPCID) 20 MG tablet Take 1 tablet (20 mg total) by mouth daily. Patient not taking: Reported on 06/27/2023 05/05/23   Gailen Shelter, PA  ibuprofen (ADVIL) 200 MG tablet Take 800 mg by mouth daily as needed for moderate pain. Patient not taking: Reported on 06/27/2023    [provider]  Prenatal Vit-Fe Phos-FA-Omega (VITAFOL GUMMIES) 3.33-0.333-34.8 MG CHEW Chew 3 tablets by mouth daily. Patient not taking: Reported on 05/04/2023 03/27/23   Lennart Pall, MD  promethazine (PHENERGAN) 25 MG tablet Take 1 tablet (25 mg total) by mouth every 6 (six) hours as needed for nausea or vomiting. Patient not taking: Reported on 05/04/2023 03/27/23   Lennart Pall, MD    ___________________________________________________________________________________________________ Physical Exam:    09/24/2023   10:00 PM 09/24/2023    9:45 PM 09/24/2023    9:00 PM  Vitals with BMI  Systolic 117 106 161  Diastolic 86 63 75  Pulse 100 74 61     1. General:  in No  Acute distress   well   -appearing 2. Psychological: Alert and   Oriented 3. Head/ENT:   Moist  Mucous Membranes                          Head Non traumatic, neck supple                          Normal  Dentition 4. SKIN: normal  Skin turgor,  Skin clean Dry and intact no rash    5. Heart: Regular rate and rhythm no  Murmur, no Rub or gallop 6. Lungs:  Clear to auscultation bilaterally, no wheezes or crackles   7. Abdomen:  Soft,  non-tender, Non distended  8. Lower extremities: no clubbing, cyanosis, no  edema 9. Neurologically Grossly intact, moving all 4 extremities equally  10. MSK: Normal range of motion    Chart has been reviewed  ______________________________________________________________________________________________  Assessment/Plan  ***  Admitted for *** Nausea and vomiting, unspecified vomiting type ***    Present on Admission: **None**     No problem-specific Assessment & Plan notes found for this encounter.    Other plan as per orders.  DVT prophylaxis:  SCD *** Lovenox       Code Status:    Code Status: Prior FULL CODE *** DNR/DNI ***comfort care as per patient ***family  I had personally discussed CODE STATUS with patient and family*  ACP *** none has been reviewed ***   Family Communication:  Family not at  Bedside  plan of care was discussed on the phone with *** Son, Daughter, Wife, Husband, Sister, Brother , father, mother  Diet  Diet Orders (From admission, onward)     Start     Ordered   09/24/23 1926  Diet NPO time specified  Diet effective now        09/24/23 1925            Disposition Plan:        Therisa Doyne 09/24/2023, 11:05 PM ***  [facility]     after 2 AM please page floor coverage PA If 7AM-7PM, please contact the day team taking care of the patient using [email or web address]

## 2023-10-03 ENCOUNTER — Ambulatory Visit
Admission: RE | Admit: 2023-10-03 | Discharge: 2023-10-03 | Disposition: A | Discharge status: Home / Self Care | Payer: Medicaid Other | Source: Ambulatory Visit | Attending: Internal Medicine | Admitting: Internal Medicine

## 2023-10-03 VITALS — BP 114/75 | HR 90 | Temp 98.4°F | Resp 14

## 2023-10-03 DIAGNOSIS — N760 Acute vaginitis: Secondary | ICD-10-CM | POA: Insufficient documentation

## 2023-10-03 MED ORDER — FLUCONAZOLE 150 MG PO TABS: 150.0000 mg | ORAL_TABLET | ORAL | 0 refills | Status: DC

## 2023-10-03 NOTE — ED Triage Notes (Signed)
Pt reports vaginal itching and white vaginal discharge x 5 days.

## 2023-10-03 NOTE — ED Provider Notes (Signed)
[name]  Note:  This document was prepared using Conservation officer, historic buildings and may include unintentional dictation errors.  MRN: [id number] DOB: 2001/11/05  Subjective:   Evelyn [name] is a 22 y.o. female presenting for 5-day history of recurrent vaginal itching and vaginal discharge.  Has had difficulty with recurrent yeast infections.  Usually needs more than 2 doses of fluconazole.  Last episode was about 3 months ago per patient.  Usually gets 3-4 yeast infections a year.  She does work with an OB/GYN but could not get in with that practice.  No concern for pregnancy, has a female partner.  Would like STI testing.  No current facility-administered medications for this encounter.  Current Outpatient Medications:    famotidine (PEPCID) 20 MG tablet, Take 1 tablet (20 mg total) by mouth daily. (Patient not taking: Reported on 06/27/2023), Disp: 30 tablet, Rfl: 0   fluconazole (DIFLUCAN) 150 MG tablet, Take 1 now and 1 in 3 days if needed, Disp: 2 tablet, Rfl: 1   ibuprofen (ADVIL) 200 MG tablet, Take 800 mg by mouth daily as needed for moderate pain. (Patient not taking: Reported on 06/27/2023), Disp: , Rfl:    ondansetron (ZOFRAN) 4 MG tablet, Take 1 tablet (4 mg total) by mouth every 6 (six) hours., Disp: 12 tablet, Rfl: 0   ondansetron (ZOFRAN-ODT) 4 MG disintegrating tablet, Take 1 tablet (4 mg total) by mouth every 8 (eight) hours as needed., Disp: 12 tablet, Rfl: 0   Prenatal Vit-Fe Phos-FA-Omega (VITAFOL GUMMIES) 3.33-0.333-34.8 MG CHEW, Chew 3 tablets by mouth daily. (Patient not taking: Reported on 05/04/2023), Disp: 90 tablet, Rfl: 12   prochlorperazine (COMPAZINE) 25 MG suppository, Place 1 suppository (25 mg total) rectally every 12 (twelve) hours as needed for nausea or vomiting., Disp: 12 suppository, Rfl: 0   promethazine (PHENERGAN) 25 MG tablet, Take 1 tablet (25 mg total) by mouth every 6 (six) hours as needed for nausea or vomiting. (Patient  not taking: Reported on 05/04/2023), Disp: 30 tablet, Rfl: 0   Allergies  Allergen Reactions   Cats Claw (Uncaria Tomentosa) Itching    Past Medical History:  Diagnosis Date   Depression    UTI (urinary tract infection)      Past Surgical History:  Procedure Laterality Date   NO PAST SURGERIES     WISDOM TOOTH EXTRACTION      Family History  Problem Relation Age of Onset   Diabetes Father    Alcohol abuse Father    Diabetes Mother     Social History   Tobacco Use   Smoking status: Former    Types: E-cigarettes    Passive exposure: Past   Smokeless tobacco: Never  Vaping Use   Vaping status: Every Day   Substances: Nicotine, Flavoring  Substance Use Topics   Alcohol use: Yes    Comment: rarely   Drug use: Yes    Frequency: 7.0 times per week    Types: Marijuana    ROS   Objective:   Vitals: BP 114/75 (BP Location: Right Arm)   Pulse 90   Temp 98.4 F (36.9 C) (Oral)   Resp 14   LMP 09/28/2023 (Approximate)   SpO2 99%   Physical Exam Constitutional:      General: She is not in acute distress.    Appearance: Normal appearance. She is well-developed. She is not ill-appearing, toxic-appearing or diaphoretic.  HENT:     Head: Normocephalic and atraumatic.     Nose:  Nose normal.     Mouth/Throat:     Mouth: Mucous membranes are moist.  Eyes:     General: No scleral icterus.       Right eye: No discharge.        Left eye: No discharge.     Extraocular Movements: Extraocular movements intact.  Cardiovascular:     Rate and Rhythm: Normal rate.  Pulmonary:     Effort: Pulmonary effort is normal.  Skin:    General: Skin is warm and dry.  Neurological:     General: No focal deficit present.     Mental Status: She is alert and oriented to person, place, and time.  Psychiatric:        Mood and Affect: Mood normal.        Behavior: Behavior normal.     Assessment and Plan :   PDMP not reviewed this encounter.  1. Acute vaginitis    Will use  an extended course of fluconazole, recommend 150 mg every 3 days for 5 total doses.  Labs pending, will treat as appropriate otherwise based off of her results. Counseled patient on potential for adverse effects with medications prescribed/recommended today, ER and return-to-clinic precautions discussed, patient verbalized understanding.    Wallis Bamberg, [place] 10/03/23 [id number]

## 2023-10-04 LAB — CERVICOVAGINAL ANCILLARY ONLY
Bacterial Vaginitis (gardnerella): NEGATIVE
Candida Glabrata: NEGATIVE
Candida Vaginitis: POSITIVE — AB
Chlamydia: NEGATIVE
Comment: NEGATIVE
Comment: NEGATIVE
Comment: NEGATIVE
Comment: NEGATIVE
Comment: NEGATIVE
Comment: NORMAL
Neisseria Gonorrhea: NEGATIVE
Trichomonas: NEGATIVE

## 2023-10-20 ENCOUNTER — Ambulatory Visit
Admission: EM | Admit: 2023-10-20 | Discharge: 2023-10-20 | Disposition: A | Discharge status: Home / Self Care | Payer: Medicaid Other | Attending: Internal Medicine | Admitting: Internal Medicine

## 2023-10-20 DIAGNOSIS — B9689 Other specified bacterial agents as the cause of diseases classified elsewhere: Secondary | ICD-10-CM | POA: Diagnosis not present

## 2023-10-20 DIAGNOSIS — N760 Acute vaginitis: Secondary | ICD-10-CM | POA: Diagnosis present

## 2023-10-20 LAB — POCT URINE PREGNANCY: Preg Test, Ur: NEGATIVE

## 2023-10-20 MED ORDER — FLUCONAZOLE 150 MG PO TABS
150.0000 mg | ORAL_TABLET | ORAL | 0 refills | Status: DC
Start: 2023-10-20 — End: 2023-11-15

## 2023-10-20 MED ORDER — METRONIDAZOLE 500 MG PO TABS: 500.0000 mg | ORAL_TABLET | Freq: Two times a day (BID) | ORAL | 0 refills | Status: DC

## 2023-10-20 NOTE — ED Triage Notes (Signed)
Pt c/o vaginal d/c before and after menstrual x 1 year-NAD-steady gait

## 2023-10-20 NOTE — ED Provider Notes (Signed)
[name]  Note:  This document was prepared using Conservation officer, historic buildings and may include unintentional dictation errors.  MRN: [id number] DOB: 2001/10/29  Subjective:   Evelyn [name] is a 22 y.o. female presenting for 2-3 day history of acute onset vaginal discharge, malodorous discharge. She is 2 days late on her cycle, is pretty regular. She is concerned that this is bacterial vaginosis.  Previously when she has had this infection, has presented with the symptoms she currently has.  No urinary symptoms.  She would like a recheck of STIs as well.  No current facility-administered medications for this encounter.  Current Outpatient Medications:    famotidine (PEPCID) 20 MG tablet, Take 1 tablet (20 mg total) by mouth daily. (Patient not taking: Reported on 06/27/2023), Disp: 30 tablet, Rfl: 0   fluconazole (DIFLUCAN) 150 MG tablet, Take 1 tablet (150 mg total) by mouth every 3 (three) days., Disp: 5 tablet, Rfl: 0   ibuprofen (ADVIL) 200 MG tablet, Take 800 mg by mouth daily as needed for moderate pain. (Patient not taking: Reported on 06/27/2023), Disp: , Rfl:    ondansetron (ZOFRAN) 4 MG tablet, Take 1 tablet (4 mg total) by mouth every 6 (six) hours., Disp: 12 tablet, Rfl: 0   ondansetron (ZOFRAN-ODT) 4 MG disintegrating tablet, Take 1 tablet (4 mg total) by mouth every 8 (eight) hours as needed., Disp: 12 tablet, Rfl: 0   Prenatal Vit-Fe Phos-FA-Omega (VITAFOL GUMMIES) 3.33-0.333-34.8 MG CHEW, Chew 3 tablets by mouth daily. (Patient not taking: Reported on 05/04/2023), Disp: 90 tablet, Rfl: 12   prochlorperazine (COMPAZINE) 25 MG suppository, Place 1 suppository (25 mg total) rectally every 12 (twelve) hours as needed for nausea or vomiting., Disp: 12 suppository, Rfl: 0   promethazine (PHENERGAN) 25 MG tablet, Take 1 tablet (25 mg total) by mouth every 6 (six) hours as needed for nausea or vomiting. (Patient not taking: Reported on 05/04/2023), Disp: 30  tablet, Rfl: 0   Allergies  Allergen Reactions   Cats Claw (Uncaria Tomentosa) Itching    Past Medical History:  Diagnosis Date   Depression    UTI (urinary tract infection)      Past Surgical History:  Procedure Laterality Date   NO PAST SURGERIES     WISDOM TOOTH EXTRACTION      Family History  Problem Relation Age of Onset   Diabetes Father    Alcohol abuse Father    Diabetes Mother     Social History   Tobacco Use   Smoking status: Former    Types: E-cigarettes    Passive exposure: Past   Smokeless tobacco: Never  Vaping Use   Vaping status: Every Day   Substances: Nicotine, Flavoring  Substance Use Topics   Alcohol use: Yes    Comment: rarely   Drug use: Yes    Frequency: 7.0 times per week    Types: Marijuana    ROS   Objective:   Vitals: BP 106/72 (BP Location: Left Arm)   Pulse 85   Temp 98.6 F (37 C) (Oral)   Resp 16   LMP 09/28/2023 (Approximate)   SpO2 99%   Physical Exam Constitutional:      General: She is not in acute distress.    Appearance: Normal appearance. She is well-developed. She is not ill-appearing, toxic-appearing or diaphoretic.  HENT:     Head: Normocephalic and atraumatic.     Nose: Nose normal.     Mouth/Throat:     Mouth:  Mucous membranes are moist.  Eyes:     General: No scleral icterus.       Right eye: No discharge.        Left eye: No discharge.     Extraocular Movements: Extraocular movements intact.  Cardiovascular:     Rate and Rhythm: Normal rate.  Pulmonary:     Effort: Pulmonary effort is normal.  Skin:    General: Skin is warm and dry.  Neurological:     General: No focal deficit present.     Mental Status: She is alert and oriented to person, place, and time.  Psychiatric:        Mood and Affect: Mood normal.        Behavior: Behavior normal.     Results for orders placed or performed during the hospital encounter of 10/20/23 (from the past 24 hour(s))  POCT urine pregnancy     Status:  None   Collection Time: 10/20/23  5:52 PM  Result Value Ref Range   Preg Test, Ur Negative Negative    Assessment and Plan :   PDMP not reviewed this encounter.  1. Bacterial vaginosis   2. Acute vaginitis    We will treat patient empirically for bacterial vaginosis with Flagyl and for yeast vaginitis with fluconazole.  Labs pending. Counseled patient on potential for adverse effects with medications prescribed/recommended today, ER and return-to-clinic precautions discussed, patient verbalized understanding.    Wallis Bamberg, [place] 10/20/23 1910

## 2023-10-23 LAB — CERVICOVAGINAL ANCILLARY ONLY
Bacterial Vaginitis (gardnerella): POSITIVE — AB
Candida Glabrata: NEGATIVE
Candida Vaginitis: NEGATIVE
Chlamydia: NEGATIVE
Comment: NEGATIVE
Comment: NEGATIVE
Comment: NEGATIVE
Comment: NEGATIVE
Comment: NEGATIVE
Comment: NORMAL
Neisseria Gonorrhea: NEGATIVE
Trichomonas: NEGATIVE

## 2023-10-28 ENCOUNTER — Emergency Department (HOSPITAL_COMMUNITY)
Admission: EM | Admit: 2023-10-28 | Discharge: 2023-10-28 | Disposition: A | Discharge status: Home / Self Care | Payer: Medicaid Other | Attending: Emergency Medicine | Admitting: Emergency Medicine

## 2023-10-28 ENCOUNTER — Encounter (HOSPITAL_COMMUNITY): Payer: Self-pay

## 2023-10-28 ENCOUNTER — Other Ambulatory Visit: Payer: Self-pay

## 2023-10-28 DIAGNOSIS — Z5329 Procedure and treatment not carried out because of patient's decision for other reasons: Secondary | ICD-10-CM | POA: Diagnosis not present

## 2023-10-28 DIAGNOSIS — N898 Other specified noninflammatory disorders of vagina: Secondary | ICD-10-CM | POA: Diagnosis present

## 2023-10-28 NOTE — ED Triage Notes (Addendum)
Patient said she wants a vaginal exam. She said "where the Qtip goes in for testing." She said it feels like her vaginal pH is off. Denies bleeding or discharge. Denies burning urination. She wants to know if she has BV or yeast infection.

## 2023-10-28 NOTE — ED Provider Notes (Signed)
[place] EMERGENCY DEPARTMENT AT [facility] Provider Note   CSN: [id number] Arrival date & time: 10/28/23  1818     History  Chief Complaint  Patient presents with   Wants Vaginal Exam    Evelyn [name] is a 22 y.o. female.  The history is provided by the patient and medical records. No language interpreter was used.    22 year old female presenting with concerns of vaginal discharge.  Patient states she has a # miscarriage early in the year she has had recurrent episodes of bacterial vaginosis.  She described as vaginal discharge with odor and pelvic discomfort.  She has been seen evaluate multiple times with this and each time she was diagnosed with bacterial vaginosis and received antibiotic with improvement of symptoms.  However she report her symptoms will return.  She felt like her vaginal pH is out of balance.  She denies douching, having sexual intercourse, or having any fever chills dysuria or vaginal bleeding.  She initially went to urgent care center for her complaint but was told to come to ER for further assessment.  She is planning to follow-up with an OB/GYN who would accept her Medicaid.  She reports frustration that she has to come to the ER multiple times for the same problem.  Home Medications Prior to Admission medications   Medication Sig Start Date End Date Taking? Authorizing Provider  famotidine (PEPCID) 20 MG tablet Take 1 tablet (20 mg total) by mouth daily. Patient not taking: Reported on 06/27/2023 05/05/23   Gailen Shelter, PA  fluconazole (DIFLUCAN) 150 MG tablet Take 1 tablet (150 mg total) by mouth every 3 (three) days. 10/20/23   Wallis Bamberg, PA-C  ibuprofen (ADVIL) 200 MG tablet Take 800 mg by mouth daily as needed for moderate pain. Patient not taking: Reported on 06/27/2023    [provider]  metroNIDAZOLE (FLAGYL) 500 MG tablet Take 1 tablet (500 mg total) by mouth 2 (two) times daily with a meal. DO NOT CONSUME ALCOHOL WHILE  TAKING THIS MEDICATION. 10/20/23   Wallis Bamberg, PA-C  ondansetron (ZOFRAN) 4 MG tablet Take 1 tablet (4 mg total) by mouth every 6 (six) hours. 09/24/23   Wynetta Fines, MD  ondansetron (ZOFRAN-ODT) 4 MG disintegrating tablet Take 1 tablet (4 mg total) by mouth every 8 (eight) hours as needed. 09/24/23   Tilden Fossa, MD  Prenatal Vit-Fe Phos-FA-Omega (VITAFOL GUMMIES) 3.33-0.333-34.8 MG CHEW Chew 3 tablets by mouth daily. Patient not taking: Reported on 05/04/2023 03/27/23   Lennart Pall, MD  prochlorperazine (COMPAZINE) 25 MG suppository Place 1 suppository (25 mg total) rectally every 12 (twelve) hours as needed for nausea or vomiting. 09/24/23   Wynetta Fines, MD  promethazine (PHENERGAN) 25 MG tablet Take 1 tablet (25 mg total) by mouth every 6 (six) hours as needed for nausea or vomiting. Patient not taking: Reported on 05/04/2023 03/27/23   Lennart Pall, MD      Allergies    Cat hair extract    Review of Systems   Review of Systems  All other systems reviewed and are negative.   Physical Exam Updated Vital Signs BP 106/67 (BP Location: Left Arm)   Pulse (!) 105   Temp 98.5 F (36.9 C) (Oral)   Resp 16   Ht 5\' 3"  (1.6 m)   Wt 51 kg   LMP 09/28/2023 (Approximate)   SpO2 100%   BMI 19.92 kg/m  Physical Exam Vitals and nursing note reviewed.  Constitutional:  General: She is not in acute distress.    Appearance: She is well-developed.  HENT:     Head: Atraumatic.  Eyes:     Conjunctiva/sclera: Conjunctivae normal.  Cardiovascular:     Rate and Rhythm: Normal rate and regular rhythm.  Pulmonary:     Effort: Pulmonary effort is normal.  Abdominal:     Palpations: Abdomen is soft.     Tenderness: There is no abdominal tenderness.  Musculoskeletal:     Cervical back: Neck supple.  Skin:    Findings: No rash.  Neurological:     Mental Status: She is alert.  Psychiatric:        Mood and Affect: Mood normal.     ED Results / Procedures /  Treatments   Labs (all labs ordered are listed, but only abnormal results are displayed) Labs Reviewed  WET PREP, GENITAL  RPR  URINALYSIS, ROUTINE W REFLEX MICROSCOPIC  HCG, SERUM, QUALITATIVE  HIV ANTIBODY (ROUTINE TESTING W REFLEX)  GC/CHLAMYDIA PROBE AMP ([place]) NOT AT [facility]    EKG None  Radiology No results found.  Procedures Procedures    Medications Ordered in ED Medications - No data to display  ED Course/ Medical Decision Making/ A&P                                 Medical Decision Making  BP 106/67 (BP Location: Left Arm)   Pulse (!) 105   Temp 98.5 F (36.9 C) (Oral)   Resp 16   Ht 5\' 3"  (1.6 m)   Wt 51 kg   LMP 09/28/2023 (Approximate)   SpO2 100%   BMI 19.92 kg/m   30:36 PM 22 year old female presenting with concerns of vaginal discharge.  Patient states she has a # miscarriage early in the year she has had recurrent episodes of bacterial vaginosis.  She described as vaginal discharge with odor and pelvic discomfort.  She has been seen evaluate multiple times with this and each time she was diagnosed with bacterial vaginosis and received antibiotic with improvement of symptoms.  However she report her symptoms will return.  She felt like her vaginal pH is out of balance.  She denies douching, having sexual intercourse, or having any fever chills dysuria or vaginal bleeding.  She initially went to urgent care center for her complaint but was told to come to ER for further assessment.  She is planning to follow-up with an OB/GYN who would accept her Medicaid.  She reports frustration that she has to come to the ER multiple times for the same problem.  Exam overall reassuring no abdominal tenderness vital sign remarkable for mild tachycardia with heart rate of 105 no fever no hypoxia.  EMR reviewed patient has been seen evaluate multiple times for same presentation each time she test positive for BV.  7:35 PM Nurse informed that patient has eloped.  We  have not obtained any labs or perform pelvic exam however at this time have very low suspicion for any acute emergent medical condition causing her symptoms.        Final Clinical Impression(s) / ED Diagnoses Final diagnoses:  Vaginal discharge    Rx / DC Orders ED Discharge Orders     None         Fayrene Helper, PA-C 10/28/23 Patience Musca, MD 10/31/23 1054

## 2023-11-14 ENCOUNTER — Other Ambulatory Visit: Payer: Self-pay

## 2023-11-14 ENCOUNTER — Encounter (HOSPITAL_COMMUNITY): Payer: Self-pay | Admitting: Emergency Medicine

## 2023-11-14 ENCOUNTER — Emergency Department (HOSPITAL_COMMUNITY)
Admission: EM | Admit: 2023-11-14 | Discharge: 2023-11-15 | Disposition: A | Discharge status: Home / Self Care | Payer: Medicaid Other | Attending: Emergency Medicine | Admitting: Emergency Medicine

## 2023-11-14 DIAGNOSIS — N939 Abnormal uterine and vaginal bleeding, unspecified: Secondary | ICD-10-CM | POA: Diagnosis not present

## 2023-11-14 DIAGNOSIS — R1031 Right lower quadrant pain: Secondary | ICD-10-CM | POA: Diagnosis present

## 2023-11-14 NOTE — ED Triage Notes (Signed)
Patient BIB EMS from home c/o abdominal cramping. Patient report her period started today and unable to tolerate cramping. Patient report nausea and vomitting x2.  BP 106/70 HR 82 RR 16 O2sat 100% on RA \ CBG 112

## 2023-11-15 ENCOUNTER — Emergency Department (HOSPITAL_COMMUNITY): Payer: Medicaid Other

## 2023-11-15 LAB — HCG, SERUM, QUALITATIVE: Preg, Serum: NEGATIVE

## 2023-11-15 LAB — COMPREHENSIVE METABOLIC PANEL
ALT: 17 U/L (ref 0–44)
AST: 26 U/L (ref 15–41)
Albumin: 4.1 g/dL (ref 3.5–5.0)
Alkaline Phosphatase: 70 U/L (ref 38–126)
Anion gap: 11 (ref 5–15)
BUN: 9 mg/dL (ref 6–20)
CO2: 20 mmol/L — ABNORMAL LOW (ref 22–32)
Calcium: 9 mg/dL (ref 8.9–10.3)
Chloride: 106 mmol/L (ref 98–111)
Creatinine, Ser: 0.78 mg/dL (ref 0.44–1.00)
GFR, Estimated: >60 mL/min (ref >60)
Glucose, Bld: 104 mg/dL — ABNORMAL HIGH (ref 70–99)
Potassium: 3.1 mmol/L — ABNORMAL LOW (ref 3.5–5.1)
Sodium: 137 mmol/L (ref 135–145)
Total Bilirubin: 0.7 mg/dL (ref <1.2)
Total Protein: 7.1 g/dL (ref 6.5–8.1)

## 2023-11-15 LAB — CBC
HCT: 41 % (ref 36.0–46.0)
Hemoglobin: 13.6 g/dL (ref 12.0–15.0)
MCH: 29.3 pg (ref 26.0–34.0)
MCHC: 33.2 g/dL (ref 30.0–36.0)
MCV: 88.4 fL (ref 80.0–100.0)
Platelets: 270 10*3/uL (ref 150–400)
RBC: 4.64 MIL/uL (ref 3.87–5.11)
RDW: 15.3 % (ref 11.5–15.5)
WBC: 14.6 10*3/uL — ABNORMAL HIGH (ref 4.0–10.5)
nRBC: 0 % (ref 0.0–0.2)

## 2023-11-15 MED ORDER — ONDANSETRON HCL 4 MG/2ML IJ SOLN
4.0000 mg | Freq: Once | INTRAMUSCULAR | Status: AC
  Administered 2023-11-15: 4 mg via INTRAVENOUS
  Filled 2023-11-15: qty 2

## 2023-11-15 MED ORDER — HYDROMORPHONE HCL 1 MG/ML IJ SOLN
1.0000 mg | Freq: Once | INTRAMUSCULAR | Status: AC
  Administered 2023-11-15: 1 mg via INTRAVENOUS
  Filled 2023-11-15: qty 1

## 2023-11-15 MED ORDER — ONDANSETRON 4 MG PO TBDP: ORAL_TABLET | ORAL | 0 refills | Status: DC

## 2023-11-15 MED ORDER — METOCLOPRAMIDE HCL 5 MG/ML IJ SOLN
10.0000 mg | Freq: Once | INTRAMUSCULAR | Status: AC
  Administered 2023-11-15: 10 mg via INTRAVENOUS
  Filled 2023-11-15: qty 2

## 2023-11-15 NOTE — ED Provider Notes (Signed)
[place] EMERGENCY DEPARTMENT AT [facility] Provider Note   CSN: [id number] Arrival date & time: 11/14/23  2329     History  Chief Complaint  Patient presents with   Abdominal Pain    Evelyn [name] is a 22 y.o. female.  Patient presents to the emergency department for evaluation of severe menstrual cramps.  Patient reports that she has been having this occur every time her period starts since she had a miscarriage earlier in the year.  Patient reports severe, uncontrollable cramping with nausea and vomiting.  She also experiences heavier than normal bleeding with her menstruation.  She has not been able to get into see an OB/GYN.       Home Medications Prior to Admission medications   Medication Sig Start Date End Date Taking? Authorizing Provider  ondansetron (ZOFRAN-ODT) 4 MG disintegrating tablet 4mg  ODT q4 hours prn nausea/vomit 11/15/23  Yes Roland Prine, Canary Brim, MD  Probiotic Product (PROBIOTIC PO) Take 1 tablet by mouth daily.   Yes [provider]  metroNIDAZOLE (FLAGYL) 500 MG tablet Take 1 tablet (500 mg total) by mouth 2 (two) times daily with a meal. DO NOT CONSUME ALCOHOL WHILE TAKING THIS MEDICATION. 10/20/23   Wallis Bamberg, PA-C  prochlorperazine (COMPAZINE) 25 MG suppository Place 1 suppository (25 mg total) rectally every 12 (twelve) hours as needed for nausea or vomiting. Patient not taking: Reported on 11/15/2023 09/24/23   Wynetta Fines, MD  promethazine (PHENERGAN) 25 MG tablet Take 1 tablet (25 mg total) by mouth every 6 (six) hours as needed for nausea or vomiting. Patient not taking: Reported on 05/04/2023 03/27/23   Lennart Pall, MD      Allergies    Cat hair extract    Review of Systems   Review of Systems  Physical Exam Updated Vital Signs BP 104/63   Pulse 96   Temp 98.5 F (36.9 C)   Resp 16   Ht 5\' 3"  (1.6 m)   Wt 51 kg   SpO2 100%   BMI 19.92 kg/m  Physical Exam Vitals and nursing note reviewed.   Constitutional:      General: She is not in acute distress.    Appearance: She is well-developed.  HENT:     Head: Normocephalic and atraumatic.     Mouth/Throat:     Mouth: Mucous membranes are moist.  Eyes:     General: Vision grossly intact. Gaze aligned appropriately.     Extraocular Movements: Extraocular movements intact.     Conjunctiva/sclera: Conjunctivae normal.  Cardiovascular:     Rate and Rhythm: Normal rate and regular rhythm.     Pulses: Normal pulses.     Heart sounds: Normal heart sounds, S1 normal and S2 normal. No murmur heard.    No friction rub. No gallop.  Pulmonary:     Effort: Pulmonary effort is normal. No respiratory distress.     Breath sounds: Normal breath sounds.  Abdominal:     General: Bowel sounds are normal.     Palpations: Abdomen is soft.     Tenderness: There is abdominal tenderness in the right lower quadrant, suprapubic area and left lower quadrant. There is no guarding or rebound.     Hernia: No hernia is present.  Musculoskeletal:        General: No swelling.     Cervical back: Full passive range of motion without pain, normal range of motion and neck supple. No spinous process tenderness or muscular tenderness. Normal range  of motion.     Right lower leg: No edema.     Left lower leg: No edema.  Skin:    General: Skin is warm and dry.     Capillary Refill: Capillary refill takes less than 2 seconds.     Findings: No ecchymosis, erythema, rash or wound.  Neurological:     General: No focal deficit present.     Mental Status: She is alert and oriented to person, place, and time.     GCS: GCS eye subscore is 4. GCS verbal subscore is 5. GCS motor subscore is 6.     Cranial Nerves: Cranial nerves 2-12 are intact.     Sensory: Sensation is intact.     Motor: Motor function is intact.     Coordination: Coordination is intact.  Psychiatric:        Attention and Perception: Attention normal.        Mood and Affect: Mood normal.         Speech: Speech normal.        Behavior: Behavior normal.     ED Results / Procedures / Treatments   Labs (all labs ordered are listed, but only abnormal results are displayed) Labs Reviewed  COMPREHENSIVE METABOLIC PANEL - Abnormal; Notable for the following components:      Result Value   Potassium 3.1 (*)    CO2 20 (*)    Glucose, Bld 104 (*)    All other components within normal limits  CBC - Abnormal; Notable for the following components:   WBC 14.6 (*)    All other components within normal limits  HCG, SERUM, QUALITATIVE  URINALYSIS, ROUTINE W REFLEX MICROSCOPIC    EKG None  Radiology US PELVIC COMPLETE W TRANSVAGINAL AND TORSION R/O  Result Date: 11/15/2023 CLINICAL DATA:  Pelvic pain for 1 day EXAM: TRANSABDOMINAL AND TRANSVAGINAL ULTRASOUND OF PELVIS DOPPLER ULTRASOUND OF OVARIES TECHNIQUE: Both transabdominal and transvaginal ultrasound examinations of the pelvis were performed. Transabdominal technique was performed for global imaging of the pelvis including uterus, ovaries, adnexal regions, and pelvic cul-de-sac. It was necessary to proceed with endovaginal exam following the transabdominal exam to visualize the ovaries. Color and duplex Doppler ultrasound was utilized to evaluate blood flow to the ovaries. COMPARISON:  None Available. FINDINGS: Uterus Measurements: 6.5 x 3.1 x 3.5 cm. = volume: 37 mL. No fibroids or other mass visualized. Endometrium Thickness: 8.7 mm.  No focal abnormality visualized. Right ovary Measurements: 3.3 x 1.8 x 2.4 cm. = volume: 7.1 mL. Normal appearance/no adnexal mass. Left ovary Measurements: 3.0 x 1.7 x 1.7 cm. = volume: 4.5 mL. Normal appearance/no adnexal mass. Pulsed Doppler evaluation of both ovaries demonstrates normal low-resistance arterial and venous waveforms. Other findings No abnormal free fluid. IMPRESSION: No acute abnormality identified. Electronically Signed   By: Alcide Clever M.D.   On: 11/15/2023 02:11     Procedures Procedures    Medications Ordered in ED Medications  metoCLOPramide (REGLAN) injection 10 mg (10 mg Intravenous Given 11/15/23 0030)  ondansetron (ZOFRAN) injection 4 mg (4 mg Intravenous Given 11/15/23 0030)  HYDROmorphone (DILAUDID) injection 1 mg (1 mg Intravenous Given 11/15/23 0031)    ED Course/ Medical Decision Making/ A&P                                 Medical Decision Making Amount and/or Complexity of Data Reviewed Labs: ordered. Radiology: ordered.  Risk Prescription drug management.  Patient with recurrent abdominal, pelvic pain associated with menstruation.  Symptoms have been present for the last several months whenever she menstruates.  Patient treated symptomatically and has improved significantly.  Lab work is reassuring.  Pregnancy negative.  Pelvic ultrasound without pathology.  Will discharge, follow-up with OB/GYN.        Final Clinical Impression(s) / ED Diagnoses Final diagnoses:  Abnormal uterine bleeding    Rx / DC Orders ED Discharge Orders          Ordered    ondansetron (ZOFRAN-ODT) 4 MG disintegrating tablet        11/15/23 0319              Gilda Crease, MD 11/15/23 [phone]

## 2023-11-20 ENCOUNTER — Ambulatory Visit (HOSPITAL_COMMUNITY)
Admission: EM | Admit: 2023-11-20 | Discharge: 2023-11-20 | Disposition: A | Discharge status: Home / Self Care | Payer: Medicaid Other | Attending: Internal Medicine | Admitting: Internal Medicine

## 2023-11-20 ENCOUNTER — Encounter (HOSPITAL_COMMUNITY): Payer: Self-pay

## 2023-11-20 DIAGNOSIS — N898 Other specified noninflammatory disorders of vagina: Secondary | ICD-10-CM | POA: Diagnosis not present

## 2023-11-20 DIAGNOSIS — Z113 Encounter for screening for infections with a predominantly sexual mode of transmission: Secondary | ICD-10-CM | POA: Insufficient documentation

## 2023-11-20 MED ORDER — METRONIDAZOLE 500 MG PO TABS: 500.0000 mg | ORAL_TABLET | Freq: Two times a day (BID) | ORAL | 0 refills | Status: AC

## 2023-11-20 NOTE — ED Provider Notes (Signed)
[facility]    CSN: [id number] Arrival date & time: 11/20/23  1856      History   Chief Complaint Chief Complaint  Patient presents with   Vaginitis    HPI Evelyn [name] is a 22 y.o. female.   Patient presenting with vaginal discharge and odor that started a few days ago.  Reports this is a recurrent issue and typically occurs around her menstrual cycle.  Reports that she had a miscarriage about 6 months ago but has not had follow-up with OB/GYN.  She did have a visit on 11/14/2023 at the emergency department given dysmenorrhea where she had a normal pelvic ultrasound and negative pregnancy test.  Denies exposure to STD and reports that she has not had sexual intercourse since miscarriage occurred.  She does not take any birth control.  Denies any associated abdominal pain, pelvic pain, fever, chills.     Past Medical History:  Diagnosis Date   Depression    UTI (urinary tract infection)     Patient Active Problem List   Diagnosis Date Noted   Nausea and vomiting 09/24/2023   Encounter for well woman exam with routine gynecological exam 06/27/2023   Dysmenorrhea 06/27/2023   Vaginal discharge 06/27/2023   Screening examination for STD (sexually transmitted disease) 06/27/2023   Marijuana user 06/27/2023   History of miscarriage 06/27/2023   ADHD (attention deficit hyperactivity disorder), combined type 07/08/2018   Oppositional defiant disorder 07/08/2018   Major depressive disorder, recurrent severe without psychotic features (HCC) 07/07/2018    Past Surgical History:  Procedure Laterality Date   NO PAST SURGERIES     WISDOM TOOTH EXTRACTION      OB History     Gravida  1   Para  0   Term  0   Preterm  0   AB  1   Living  0      SAB  1   IAB  0   Ectopic  0   Multiple  0   Live Births  0            Home Medications    Prior to Admission medications   Medication Sig Start Date End Date Taking? Authorizing Provider   metroNIDAZOLE (FLAGYL) 500 MG tablet Take 1 tablet (500 mg total) by mouth 2 (two) times daily. 11/20/23  Yes Kimley Apsey, Rolly Salter E, FNP  Probiotic Product (PROBIOTIC PO) Take 1 tablet by mouth daily.   Yes [provider]  ondansetron (ZOFRAN-ODT) 4 MG disintegrating tablet 4mg  ODT q4 hours prn nausea/vomit 11/15/23   Pollina, Canary Brim, MD  prochlorperazine (COMPAZINE) 25 MG suppository Place 1 suppository (25 mg total) rectally every 12 (twelve) hours as needed for nausea or vomiting. Patient not taking: Reported on 11/15/2023 09/24/23   Wynetta Fines, MD  promethazine (PHENERGAN) 25 MG tablet Take 1 tablet (25 mg total) by mouth every 6 (six) hours as needed for nausea or vomiting. Patient not taking: Reported on 05/04/2023 03/27/23   Lennart Pall, MD    Family History Family History  Problem Relation Age of Onset   Diabetes Father    Alcohol abuse Father    Diabetes Mother     Social History Social History   Tobacco Use   Smoking status: Former    Types: E-cigarettes    Passive exposure: Past   Smokeless tobacco: Never  Vaping Use   Vaping status: Every Day   Substances: Nicotine, Flavoring  Substance Use Topics  Alcohol use: Yes    Comment: rarely   Drug use: Yes    Types: Marijuana     Allergies   Cat hair extract   Review of Systems Review of Systems Per HPI  Physical Exam Triage Vital Signs ED Triage Vitals [11/20/23 1950]  Encounter Vitals Group     BP 113/66     Systolic BP Percentile      Diastolic BP Percentile      Pulse Rate 99     Resp 16     Temp 98.1 F (36.7 C)     Temp Source Oral     SpO2 98 %     Weight      Height      Head Circumference      Peak Flow      Pain Score      Pain Loc      Pain Education      Exclude from Growth Chart    No data found.  Updated Vital Signs BP 113/66 (BP Location: Left Arm)   Pulse 99   Temp 98.1 F (36.7 C) (Oral)   Resp 16   LMP 11/15/2023   SpO2 98%   Visual Acuity Right  Eye Distance:   Left Eye Distance:   Bilateral Distance:    Right Eye Near:   Left Eye Near:    Bilateral Near:     Physical Exam Constitutional:      General: She is not in acute distress.    Appearance: Normal appearance. She is not toxic-appearing or diaphoretic.  HENT:     Head: Normocephalic and atraumatic.  Eyes:     Extraocular Movements: Extraocular movements intact.     Conjunctiva/sclera: Conjunctivae normal.  Pulmonary:     Effort: Pulmonary effort is normal.  Genitourinary:    Comments: Deferred with shared decision making.  Self swab performed. Neurological:     General: No focal deficit present.     Mental Status: She is alert and oriented to person, place, and time. Mental status is at baseline.  Psychiatric:        Mood and Affect: Mood normal.        Behavior: Behavior normal.        Thought Content: Thought content normal.        Judgment: Judgment normal.      UC Treatments / Results  Labs (all labs ordered are listed, but only abnormal results are displayed) Labs Reviewed  CERVICOVAGINAL ANCILLARY ONLY    EKG   Radiology No results found.  Procedures Procedures (including critical care time)  Medications Ordered in UC Medications - No data to display  Initial Impression / Assessment and Plan / UC Course  I have reviewed the triage vital signs and the nursing notes.  Pertinent labs & imaging results that were available during my care of the patient were reviewed by me and considered in my medical decision making (see chart for details).     I highly suspect bacterial vaginosis.  Given this is a recurrent issue, will opt to treat with metronidazole today while awaiting cervicovaginal swab.  Patient reports that she has not been sexually active in a few months but is requesting STD testing on vaginal swab so this is pending as well.  Patient provided with contact information for gynecology for follow-up given that she has not had follow-up  since miscarriage.  She has been having monthly menstrual cycles with recent normal pelvic ultrasound and negative pregnancy  test.  She just completed her menstrual cycle as well so urine pregnancy test was deferred today.  Advised strict follow-up precautions.  Patient verbalized understanding and was agreeable with plan. Final Clinical Impressions(s) / UC Diagnoses   Final diagnoses:  Vaginal odor  Vaginal discharge  Screening examination for venereal disease     Discharge Instructions      I have prescribed you metronidazole for concern for bacterial vaginosis.  Vaginal swab is pending.  We will call if results are positive.    ED Prescriptions     Medication Sig Dispense Auth. Provider   metroNIDAZOLE (FLAGYL) 500 MG tablet Take 1 tablet (500 mg total) by mouth 2 (two) times daily. 14 tablet [place], Acie Fredrickson, [place]      PDMP not reviewed this encounter.   Gustavus Bryant, [place] 11/20/23 2011

## 2023-11-20 NOTE — Discharge Instructions (Signed)
I have prescribed you metronidazole for concern for bacterial vaginosis.  Vaginal swab is pending.  We will call if results are positive.

## 2023-11-20 NOTE — ED Triage Notes (Signed)
Pt presents with vaginal discharge. Pt reports this is recurrent issue for her.

## 2023-11-22 ENCOUNTER — Telehealth: Payer: Self-pay

## 2023-11-22 LAB — CERVICOVAGINAL ANCILLARY ONLY
Bacterial Vaginitis (gardnerella): POSITIVE — AB
Candida Glabrata: NEGATIVE
Candida Vaginitis: NEGATIVE
Chlamydia: NEGATIVE
Comment: NEGATIVE
Comment: NEGATIVE
Comment: NEGATIVE
Comment: NEGATIVE
Comment: NEGATIVE
Comment: NORMAL
Neisseria Gonorrhea: NEGATIVE
Trichomonas: NEGATIVE

## 2023-11-22 MED ORDER — FLUCONAZOLE 150 MG PO TABS: 150.0000 mg | ORAL_TABLET | Freq: Once | ORAL | 0 refills | Status: AC

## 2023-11-22 NOTE — Telephone Encounter (Signed)
Pt called regarding prescribed metronidazole. She states she was discussed with provider that she would need "the highest dose" of Diflucan if she needed to take metronidazole.  Per protocol, can give Diflucan x2 doses to pts requesting Diflucan for abx-related yeast.  Advised pt would send 2 doses and route message to provider, who could determine whether or not to prescribe third dose.

## 2023-12-04 ENCOUNTER — Ambulatory Visit (INDEPENDENT_AMBULATORY_CARE_PROVIDER_SITE_OTHER): Payer: Medicaid Other | Admitting: Family Medicine

## 2023-12-04 VITALS — BP 104/72 | HR 64 | Temp 98.7°F | Ht 63.0 in | Wt 109.4 lb

## 2023-12-04 DIAGNOSIS — F331 Major depressive disorder, recurrent, moderate: Secondary | ICD-10-CM | POA: Diagnosis not present

## 2023-12-04 DIAGNOSIS — N760 Acute vaginitis: Secondary | ICD-10-CM

## 2023-12-04 MED ORDER — METRONIDAZOLE 1 % EX GEL: Freq: Every day | CUTANEOUS | 0 refills | Status: AC

## 2023-12-04 MED ORDER — FLUCONAZOLE 150 MG PO TABS: 150.0000 mg | ORAL_TABLET | Freq: Once | ORAL | 0 refills | Status: AC

## 2023-12-04 NOTE — Progress Notes (Signed)
New Patient Office Visit  Subjective    Patient ID: Evelyn [name], female    DOB: 12-Jan-2001  Age: 22 y.o. MRN: [id number]  CC:  Chief Complaint  Patient presents with   New Patient (Initial Visit)    Sexual health( want referral to GYN)     HPI Evelyn [name] presents to establish care today. She would like a referral to get established with gynecology. Has history of recurrent vaginitis. Reports that she will have BV, treat with antibiotics, then have recurrent yeast infections. States that it seems to occur after every menstrual cycle. Reports miscarriage earlier this year. Also has hx depression. Has seen a therapist about 5 years ago, not since then. Is not currently being treated for this.  Denies SI, HI. Denies other concerns today. Medical hx as outlined below.    Outpatient Encounter Medications as of 12/04/2023  Medication Sig   fluconazole (DIFLUCAN) 150 MG tablet Take 1 tablet (150 mg total) by mouth once for 1 dose.   metroNIDAZOLE (FLAGYL) 500 MG tablet Take 1 tablet (500 mg total) by mouth 2 (two) times daily.   metroNIDAZOLE (METROGEL) 1 % gel Apply topically daily for 7 days.   ondansetron (ZOFRAN-ODT) 4 MG disintegrating tablet 4mg  ODT q4 hours prn nausea/vomit (Patient taking differently: as needed for vomiting or nausea. 4mg  ODT q4 hours prn nausea/vomit)   Probiotic Product (PROBIOTIC PO) Take 1 tablet by mouth daily.   prochlorperazine (COMPAZINE) 25 MG suppository Place 1 suppository (25 mg total) rectally every 12 (twelve) hours as needed for nausea or vomiting. (Patient not taking: Reported on 11/15/2023)   promethazine (PHENERGAN) 25 MG tablet Take 1 tablet (25 mg total) by mouth every 6 (six) hours as needed for nausea or vomiting. (Patient not taking: Reported on 05/04/2023)   No facility-administered encounter medications on file as of 12/04/2023.    Past Medical History:  Diagnosis Date   Depression    UTI (urinary tract infection)      Past Surgical History:  Procedure Laterality Date   NO PAST SURGERIES     WISDOM TOOTH EXTRACTION      Family History  Problem Relation Age of Onset   Diabetes Father    Alcohol abuse Father    Diabetes Mother     Social History   Socioeconomic History   Marital status: Single    Spouse name: Not on file   Number of children: Not on file   Years of education: Not on file   Highest education level: 12th grade  Occupational History   Not on file  Tobacco Use   Smoking status: Former    Types: E-cigarettes    Passive exposure: Past   Smokeless tobacco: Never  Vaping Use   Vaping status: Every Day   Substances: Nicotine, Flavoring  Substance and Sexual Activity   Alcohol use: Yes    Comment: rarely   Drug use: Yes    Types: Marijuana   Sexual activity: Not Currently    Partners: Female, Female    Birth control/protection: Abstinence  Other Topics Concern   Not on file  Social History Narrative   ** Merged History Encounter **       Social Determinants of Health   Financial Resource Strain: Low Risk  (12/04/2023)   Overall Financial Resource Strain (CARDIA)    Difficulty of Paying Living Expenses: Not very hard  Food Insecurity: Food Insecurity Present (12/04/2023)   Hunger Vital Sign    Worried About Running  Out of Food in the Last Year: Never true    Ran Out of Food in the Last Year: Sometimes true  Transportation Needs: Unmet Transportation Needs (12/04/2023)   PRAPARE - Transportation    Lack of Transportation (Medical): Yes    Lack of Transportation (Non-Medical): Yes  Physical Activity: Insufficiently Active (12/04/2023)   Exercise Vital Sign    Days of Exercise per Week: 3 days    Minutes of Exercise per Session: 20 min  Stress: Stress Concern Present (12/04/2023)   [employer] of Occupational Health - Occupational Stress Questionnaire    Feeling of Stress : Very much  Social Connections: Unknown (12/04/2023)   Social Connection and Isolation  Panel [NHANES]    Frequency of Communication with Friends and Family: Three times a week    Frequency of Social Gatherings with Friends and Family: Once a week    Attends Religious Services: 1 to 4 times per year    Active Member of [employer] or Organizations: No    Attends Banker Meetings: Never    Marital Status: Patient declined  Catering manager Violence: Not At Risk (06/27/2023)   Humiliation, Afraid, Rape, and Kick questionnaire    Fear of Current or Ex-Partner: No    Emotionally Abused: No    Physically Abused: No    Sexually Abused: No    ROS Per HPI      Objective    BP 104/72   Pulse 64   Temp 98.7 F (37.1 C) (Temporal)   Ht 5\' 3"  (1.6 m)   Wt 109 lb 6 oz (49.6 kg)   LMP 11/15/2023   BMI 19.37 kg/m   Physical Exam Vitals and nursing note reviewed.  Constitutional:      Appearance: Normal appearance. She is normal weight.  HENT:     Head: Normocephalic and atraumatic.     Right Ear: Tympanic membrane and ear canal normal.     Left Ear: Tympanic membrane and ear canal normal.     Nose: Nose normal.  Eyes:     Extraocular Movements: Extraocular movements intact.     Pupils: Pupils are equal, round, and reactive to light.  Cardiovascular:     Rate and Rhythm: Normal rate and regular rhythm.     Heart sounds: Normal heart sounds.  Pulmonary:     Effort: Pulmonary effort is normal.     Breath sounds: Normal breath sounds.  Musculoskeletal:        General: Normal range of motion.     Cervical back: Normal range of motion.  Neurological:     General: No focal deficit present.     Mental Status: She is alert and oriented to person, place, and time.  Psychiatric:        Mood and Affect: Mood normal.        Thought Content: Thought content normal.         Assessment & Plan:   Recurrent vaginitis -     Ambulatory referral to Obstetrics / Gynecology -     metroNIDAZOLE; Apply topically daily for 7 days.  Dispense: 45 g; Refill: 0 -      Fluconazole; Take 1 tablet (150 mg total) by mouth once for 1 dose.  Dispense: 3 tablet; Refill: 0  Moderate episode of recurrent major depressive disorder (HCC) -     Ambulatory referral to Psychiatry     Return in about 6 months (around 06/03/2024) for CPE.   Moshe Cipro, FNP

## 2023-12-09 ENCOUNTER — Emergency Department (HOSPITAL_COMMUNITY)
Admission: EM | Admit: 2023-12-09 | Discharge: 2023-12-10 | Discharge status: Against Medical Advice | Payer: Medicaid Other | Attending: Emergency Medicine | Admitting: Emergency Medicine

## 2023-12-09 ENCOUNTER — Other Ambulatory Visit: Payer: Self-pay

## 2023-12-09 ENCOUNTER — Encounter (HOSPITAL_COMMUNITY): Payer: Self-pay

## 2023-12-09 DIAGNOSIS — R1110 Vomiting, unspecified: Secondary | ICD-10-CM | POA: Insufficient documentation

## 2023-12-09 DIAGNOSIS — R109 Unspecified abdominal pain: Secondary | ICD-10-CM | POA: Diagnosis present

## 2023-12-09 DIAGNOSIS — Z5321 Procedure and treatment not carried out due to patient leaving prior to being seen by health care provider: Secondary | ICD-10-CM | POA: Diagnosis not present

## 2023-12-09 LAB — URINALYSIS, ROUTINE W REFLEX MICROSCOPIC
Bilirubin Urine: NEGATIVE
Glucose, UA: NEGATIVE mg/dL
Hgb urine dipstick: NEGATIVE
Ketones, ur: 5 mg/dL — AB
Leukocytes,Ua: NEGATIVE
Nitrite: NEGATIVE
Protein, ur: 30 mg/dL — AB
Specific Gravity, Urine: 1.026 (ref 1.005–1.030)
pH: 6 (ref 5.0–8.0)

## 2023-12-09 LAB — COMPREHENSIVE METABOLIC PANEL
ALT: 15 U/L (ref 0–44)
AST: 22 U/L (ref 15–41)
Albumin: 4 g/dL (ref 3.5–5.0)
Alkaline Phosphatase: 59 U/L (ref 38–126)
Anion gap: 15 (ref 5–15)
BUN: 8 mg/dL (ref 6–20)
CO2: 22 mmol/L (ref 22–32)
Calcium: 9.5 mg/dL (ref 8.9–10.3)
Chloride: 103 mmol/L (ref 98–111)
Creatinine, Ser: 0.88 mg/dL (ref 0.44–1.00)
GFR, Estimated: >60 mL/min (ref >60)
Glucose, Bld: 96 mg/dL (ref 70–99)
Potassium: 3.6 mmol/L (ref 3.5–5.1)
Sodium: 140 mmol/L (ref 135–145)
Total Bilirubin: 0.6 mg/dL (ref <1.2)
Total Protein: 7.1 g/dL (ref 6.5–8.1)

## 2023-12-09 LAB — CBC
HCT: 40.2 % (ref 36.0–46.0)
Hemoglobin: 13.5 g/dL (ref 12.0–15.0)
MCH: 29.2 pg (ref 26.0–34.0)
MCHC: 33.6 g/dL (ref 30.0–36.0)
MCV: 87 fL (ref 80.0–100.0)
Platelets: 324 10*3/uL (ref 150–400)
RBC: 4.62 MIL/uL (ref 3.87–5.11)
RDW: 15 % (ref 11.5–15.5)
WBC: 13.7 10*3/uL — ABNORMAL HIGH (ref 4.0–10.5)
nRBC: 0 % (ref 0.0–0.2)

## 2023-12-09 LAB — HCG, SERUM, QUALITATIVE: Preg, Serum: NEGATIVE

## 2023-12-09 LAB — LIPASE, BLOOD: Lipase: 52 U/L — ABNORMAL HIGH (ref 11–51)

## 2023-12-09 NOTE — ED Triage Notes (Signed)
Pt to ED c/o abdominal cramping and emesis x 1 day, reports associated with being on menstrual cycle. Pt reports this has been an ongoing problem x 4 months when on menstrual cycle and has been evaluated for the same and was told "everything looks fine"

## 2023-12-10 NOTE — ED Notes (Signed)
Pt called for room with no answer. 

## 2023-12-27 ENCOUNTER — Encounter (HOSPITAL_BASED_OUTPATIENT_CLINIC_OR_DEPARTMENT_OTHER): Payer: Self-pay | Admitting: Emergency Medicine

## 2023-12-27 ENCOUNTER — Other Ambulatory Visit: Payer: Self-pay

## 2023-12-27 ENCOUNTER — Emergency Department (HOSPITAL_BASED_OUTPATIENT_CLINIC_OR_DEPARTMENT_OTHER)
Admission: EM | Admit: 2023-12-27 | Discharge: 2023-12-27 | Disposition: A | Discharge status: Home / Self Care | Payer: Medicaid Other | Attending: Emergency Medicine | Admitting: Emergency Medicine

## 2023-12-27 DIAGNOSIS — Z20822 Contact with and (suspected) exposure to covid-19: Secondary | ICD-10-CM | POA: Diagnosis not present

## 2023-12-27 DIAGNOSIS — R1010 Upper abdominal pain, unspecified: Secondary | ICD-10-CM | POA: Diagnosis not present

## 2023-12-27 DIAGNOSIS — R112 Nausea with vomiting, unspecified: Secondary | ICD-10-CM | POA: Insufficient documentation

## 2023-12-27 DIAGNOSIS — R197 Diarrhea, unspecified: Secondary | ICD-10-CM | POA: Insufficient documentation

## 2023-12-27 DIAGNOSIS — F1090 Alcohol use, unspecified, uncomplicated: Secondary | ICD-10-CM | POA: Diagnosis not present

## 2023-12-27 DIAGNOSIS — Z79899 Other long term (current) drug therapy: Secondary | ICD-10-CM | POA: Diagnosis not present

## 2023-12-27 HISTORY — DX: Gastritis, unspecified, without bleeding: K29.70

## 2023-12-27 LAB — COMPREHENSIVE METABOLIC PANEL
ALT: 13 U/L (ref 0–44)
AST: 22 U/L (ref 15–41)
Albumin: 4.6 g/dL (ref 3.5–5.0)
Alkaline Phosphatase: 79 U/L (ref 38–126)
Anion gap: 10 (ref 5–15)
BUN: 6 mg/dL (ref 6–20)
CO2: 26 mmol/L (ref 22–32)
Calcium: 9.5 mg/dL (ref 8.9–10.3)
Chloride: 104 mmol/L (ref 98–111)
Creatinine, Ser: 0.74 mg/dL (ref 0.44–1.00)
GFR, Estimated: >60 mL/min (ref >60)
Glucose, Bld: 87 mg/dL (ref 70–99)
Potassium: 3.7 mmol/L (ref 3.5–5.1)
Sodium: 140 mmol/L (ref 135–145)
Total Bilirubin: 0.6 mg/dL (ref 0.0–1.2)
Total Protein: 7.2 g/dL (ref 6.5–8.1)

## 2023-12-27 LAB — HCG, SERUM, QUALITATIVE: Preg, Serum: NEGATIVE

## 2023-12-27 LAB — CBC
HCT: 40.2 % (ref 36.0–46.0)
Hemoglobin: 13.8 g/dL (ref 12.0–15.0)
MCH: 29 pg (ref 26.0–34.0)
MCHC: 34.3 g/dL (ref 30.0–36.0)
MCV: 84.5 fL (ref 80.0–100.0)
Platelets: 283 10*3/uL (ref 150–400)
RBC: 4.76 MIL/uL (ref 3.87–5.11)
RDW: 14.7 % (ref 11.5–15.5)
WBC: 12.4 10*3/uL — ABNORMAL HIGH (ref 4.0–10.5)
nRBC: 0 % (ref 0.0–0.2)

## 2023-12-27 LAB — SARS CORONAVIRUS 2 BY RT PCR: SARS Coronavirus 2 by RT PCR: NEGATIVE

## 2023-12-27 LAB — LIPASE, BLOOD: Lipase: 33 U/L (ref 11–51)

## 2023-12-27 MED ORDER — ONDANSETRON HCL 4 MG/2ML IJ SOLN
4.0000 mg | Freq: Once | INTRAMUSCULAR | Status: AC
  Administered 2023-12-27: 4 mg via INTRAVENOUS
  Filled 2023-12-27: qty 2

## 2023-12-27 MED ORDER — ONDANSETRON 4 MG PO TBDP: 4.0000 mg | ORAL_TABLET | Freq: Three times a day (TID) | ORAL | 0 refills | Status: DC | PRN

## 2023-12-27 MED ORDER — SODIUM CHLORIDE 0.9 % IV BOLUS
1000.0000 mL | Freq: Once | INTRAVENOUS | Status: AC
  Administered 2023-12-27: 1000 mL via INTRAVENOUS

## 2023-12-27 MED ORDER — OMEPRAZOLE 20 MG PO CPDR: 20.0000 mg | DELAYED_RELEASE_CAPSULE | Freq: Every day | ORAL | 0 refills | Status: AC | PRN

## 2023-12-27 NOTE — ED Provider Notes (Signed)
 [place] EM[facility]ENCY DEPARTMENT AT [facility] Provider Note   CSN: [id number] Arrival date & time: 12/27/23  9264     History  Chief Complaint  Patient presents with   Emesis    Evelyn [name] is a 23 y.o. female.  HPI Patient presents with nausea and vomiting.  Had some diarrhea.  Has been feeling bad for a few days now however.  Had URI type symptoms.  States had some diarrhea.  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  Vomiting more this morning.  Some upper abdominal pain.  States she has had gastritis in the past.  Patient hopes she is not pregnant.   Past Medical History:  Diagnosis Date   Depression    Gastritis    UTI (urinary tract infection)     Home Medications Prior to Admission medications   Medication Sig Start Date End Date Taking? Authorizing Provider  omeprazole  (PRILOSEC) 20 MG capsule Take 1 capsule (20 mg total) by mouth daily as needed. 12/27/23  Yes Patsey Lot, MD  ondansetron  (ZOFRAN -ODT) 4 MG disintegrating tablet Take 1 tablet (4 mg total) by mouth every 8 (eight) hours as needed. 12/27/23  Yes Patsey Lot, MD  metroNIDAZOLE  (FLAGYL ) 500 MG tablet Take 1 tablet (500 mg total) by mouth 2 (two) times daily. 11/20/23   Hazen Darryle BRAVO, FNP  Probiotic Product (PROBIOTIC PO) Take 1 tablet by mouth daily.    [provider]  prochlorperazine  (COMPAZINE ) 25 MG suppository Place 1 suppository (25 mg total) rectally every 12 (twelve) hours as needed for nausea or vomiting. Patient not taking: Reported on 11/15/2023 09/24/23   Laurice Maude BROCKS, MD  promethazine  (PHENERGAN ) 25 MG tablet Take 1 tablet (25 mg total) by mouth every 6 (six) hours as needed for nausea or vomiting. Patient not taking: Reported on 05/04/2023 03/27/23   Erik Kieth BROCKS, MD      Allergies    Cat hair extract and Ibuprofen     Review of Systems   Review of Systems  Physical Exam Updated Vital Signs BP 111/67   Pulse 80   Temp 97.9 F (36.6 C)    Resp 18   Ht 5' 3 (1.6 m)   Wt 49.9 kg   LMP 12/09/2023   SpO2 100%   BMI 19.49 kg/m  Physical Exam Vitals and nursing note reviewed.  Eyes:     Pupils: Pupils are equal, round, and reactive to light.  Cardiovascular:     Rate and Rhythm: Normal rate.  Abdominal:     Tenderness: There is abdominal tenderness.     Comments: Mild upper abdominal tenderness without rebound or guarding.  No hernia palpated.  Musculoskeletal:        General: No tenderness.  Neurological:     Mental Status: She is alert and oriented to person, place, and time.     ED Results / Procedures / Treatments   Labs (all labs ordered are listed, but only abnormal results are displayed) Labs Reviewed  CBC - Abnormal; Notable for the following components:      Result Value   WBC 12.4 (*)    All other components within normal limits  SARS CORONAVIRUS 2 BY RT PCR  HCG, SERUM, QUALITATIVE  COMPREHENSIVE METABOLIC PANEL  LIPASE, BLOOD    EKG None  Radiology No results found.  Procedures Procedures    Medications Ordered in ED Medications  ondansetron  (ZOFRAN ) injection 4 mg (4 mg Intravenous Given 12/27/23 0912)  sodium chloride  0.9 %  bolus 1,000 mL (0 mLs Intravenous Stopped 12/27/23 1038)    ED Course/ Medical Decision Making/ A&P                                 Medical Decision Making Amount and/or Complexity of Data Reviewed Labs: ordered.  Risk Prescription drug management.   Patient with URI symptoms nausea vomiting some diarrhea.  Also had had some heavy alcohol use.  Initial vitals reassuring.  Differential diagnosis does include causes such as viral infection but also causes such as alcoholic gastritis.  Will get basic blood.  Will give antiemetics and some fluids.  Will check pregnancy test.  Blood work reassuring.  Tolerate orals.  Feels better after fluids.  Will discharge with symptomatic treatment.  Outpatient follow-up as needed.        Final Clinical Impression(s) /  ED Diagnoses Final diagnoses:  Nausea and vomiting, unspecified vomiting type    Rx / DC Orders ED Discharge Orders          Ordered    ondansetron  (ZOFRAN -ODT) 4 MG disintegrating tablet  Every 8 hours PRN        12/27/23 1029    omeprazole  (PRILOSEC) 20 MG capsule  Daily PRN        12/27/23 1029              Patsey Lot, MD 12/27/23 1453

## 2023-12-27 NOTE — ED Notes (Signed)
Reviewed discharge instructions, medications, and home care with pt. Pt verbalized understanding and had no further questions. Pt exited ED without complications.

## 2023-12-27 NOTE — ED Triage Notes (Signed)
 Vomiting this am, probably had too much alcohol

## 2024-01-24 ENCOUNTER — Ambulatory Visit (HOSPITAL_COMMUNITY): Payer: Medicaid Other | Admitting: Family

## 2024-02-05 ENCOUNTER — Telehealth: Payer: Self-pay

## 2024-02-05 NOTE — Telephone Encounter (Signed)
 Attempted to call patient to confirm her appointment for 2/12 but the line kept ringing. I was unable to leave a voicemail. Sent patient a mychart message to confirm her appointment.

## 2024-02-07 ENCOUNTER — Ambulatory Visit: Payer: Medicaid Other | Admitting: Family Medicine

## 2024-02-07 VITALS — BP 111/68 | HR 71 | Ht 63.0 in | Wt 114.0 lb

## 2024-02-07 DIAGNOSIS — R1115 Cyclical vomiting syndrome unrelated to migraine: Secondary | ICD-10-CM

## 2024-02-07 MED ORDER — NORETHIN ACE-ETH ESTRAD-FE 1-20 MG-MCG(24) PO TABS: 1.0000 | ORAL_TABLET | Freq: Every day | ORAL | 3 refills | Status: DC

## 2024-02-07 NOTE — Progress Notes (Signed)
   Subjective:    Patient ID: Evelyn [name], female    DOB: 2001/07/01, 22 y.o.   MRN: [id number]  HPI Patient seen to establish care.  She had a SAB last year.  Since then, she has had very difficult menses that are approximately every 25 days.  During her menses, she gets very sick with vomiting.  Her periods are fairly heavy.  Additionally, she has been getting frequent BV infections, although not in the last 2 or 3 months.  She has 1 sexual partner.   She used to be on birth control, but came off it last year.  He is currently not doing anything hormonal for contraception.   Review of Systems     Objective:   Physical Exam Vitals and nursing note reviewed.  Constitutional:      Appearance: Normal appearance.  Cardiovascular:     Rate and Rhythm: Normal rate and regular rhythm.  Pulmonary:     Effort: Pulmonary effort is normal.  Abdominal:     General: Abdomen is flat. There is no distension.     Palpations: Abdomen is soft.     Tenderness: There is no abdominal tenderness. There is no guarding or rebound.  Neurological:     Mental Status: She is alert.  Psychiatric:        Mood and Affect: Mood normal.        Behavior: Behavior normal.        Thought Content: Thought content normal.        Judgment: Judgment normal.       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 Recommended continuous COCs in order to minimize cyclical vomiting. This may take a few months in order to be fully effective.

## 2024-02-13 ENCOUNTER — Telehealth: Payer: Self-pay

## 2024-02-13 ENCOUNTER — Other Ambulatory Visit: Payer: Self-pay

## 2024-02-13 ENCOUNTER — Ambulatory Visit
Admission: RE | Admit: 2024-02-13 | Discharge: 2024-02-13 | Disposition: A | Discharge status: Home / Self Care | Payer: Medicaid Other | Source: Ambulatory Visit | Attending: Family Medicine | Admitting: Family Medicine

## 2024-02-13 VITALS — BP 105/70 | HR 99 | Temp 97.9°F | Resp 16

## 2024-02-13 DIAGNOSIS — R300 Dysuria: Secondary | ICD-10-CM | POA: Diagnosis not present

## 2024-02-13 DIAGNOSIS — N3090 Cystitis, unspecified without hematuria: Secondary | ICD-10-CM | POA: Diagnosis not present

## 2024-02-13 LAB — POCT URINALYSIS DIP (MANUAL ENTRY)
Bilirubin, UA: NEGATIVE
Glucose, UA: NEGATIVE mg/dL
Ketones, POC UA: NEGATIVE mg/dL
Nitrite, UA: NEGATIVE
Protein Ur, POC: 100 mg/dL — AB
Spec Grav, UA: >=1.03 — AB (ref 1.010–1.025)
Urobilinogen, UA: 1 U/dL
pH, UA: 6 (ref 5.0–8.0)

## 2024-02-13 LAB — POCT URINE PREGNANCY: Preg Test, Ur: NEGATIVE

## 2024-02-13 MED ORDER — SULFAMETHOXAZOLE-TRIMETHOPRIM 800-160 MG PO TABS: 1.0000 | ORAL_TABLET | Freq: Two times a day (BID) | ORAL | 0 refills | Status: AC

## 2024-02-13 MED ORDER — FLUCONAZOLE 150 MG PO TABS: 150.0000 mg | ORAL_TABLET | Freq: Every day | ORAL | 0 refills | Status: AC

## 2024-02-13 NOTE — ED Triage Notes (Signed)
 Pt presents with concerns of urinary frequency x 4 days. Pt states she is having unusual odors and light blood when wiping (not on period). Pt currently rates her pain a 7/10 when urinating. Pt also having mild cramps. Pt voices she is having sexual intercourse with new partner.

## 2024-02-13 NOTE — ED Provider Notes (Signed)
 [name]    CSN: [id number] Arrival date & time: 02/13/24  1136      History   Chief Complaint Chief Complaint  Patient presents with   Urinary Frequency    Waiting in Car [phone] - Been having mild cramps in my stomach.. when urinating my pee is very little & gives me discomfort. - Entered by patient    HPI Evelyn [name] is a 23 y.o. female.    Urinary Frequency  Suspected urinary tract infection symptom onset several days ago symptoms include dysuria, frequency, urgency, hematuria, suprapubic pressure.  Admits new sexual contact female partner did not use protection consistently.  Admits slight vaginal discharge, denies vaginal bleeding, injury.  Agrees to cytology swab for STI testing but declined blood test for HIV and RPR.  States she used to have only female partners now has a new female partner. Admits slight cough. Denies fever, chills, abdominal pain, nausea, vomiting, genital rash, vaginal itching or burning, back pain dizziness or lightheadedness.  LMP 01/28/2024  Past Medical History:  Diagnosis Date   Depression    Gastritis    UTI (urinary tract infection)     Patient Active Problem List   Diagnosis Date Noted   Nausea and vomiting 09/24/2023   Encounter for well woman exam with routine gynecological exam 06/27/2023   Dysmenorrhea 06/27/2023   Vaginal discharge 06/27/2023   Screening examination for STD (sexually transmitted disease) 06/27/2023   Marijuana user 06/27/2023   History of miscarriage 06/27/2023   ADHD (attention deficit hyperactivity disorder), combined type 07/08/2018   Oppositional defiant disorder 07/08/2018   Major depressive disorder, 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 Authorizing Provider  metroNIDAZOLE (FLAGYL) 500 MG tablet Take 1 tablet (500 mg total) by mouth 2 (two) times daily. Patient not taking: Reported on 02/07/2024 11/20/23   Gustavus Bryant, FNP  Norethindrone Acetate-Ethinyl Estrad-FE (LOESTRIN 24 FE) 1-20 MG-MCG(24) tablet Take 1 tablet by mouth daily. Skip non-hormone days and start a new pack to allow for continuous suppression. 02/07/24   Levie Heritage, DO  omeprazole (PRILOSEC) 20 MG capsule Take 1 capsule (20 mg total) by mouth daily as needed. 12/27/23   Benjiman Core, MD  ondansetron (ZOFRAN-ODT) 4 MG disintegrating tablet Take 1 tablet (4 mg total) by mouth every 8 (eight) hours as needed. 12/27/23   Benjiman Core, MD  Probiotic Product (PROBIOTIC PO) Take 1 tablet by mouth daily. Patient not taking: Reported on 02/07/2024    [provider]  prochlorperazine (COMPAZINE) 25 MG suppository Place 1 suppository (25 mg total) rectally every 12 (twelve) hours as needed for nausea or vomiting. Patient not taking: Reported on 11/15/2023 09/24/23   Wynetta Fines, MD  promethazine (PHENERGAN) 25 MG tablet Take 1 tablet (25 mg total) by mouth every 6 (  six) hours as needed for nausea or vomiting. Patient not taking: Reported on 05/04/2023 03/27/23   Lennart Pall, MD    Family History Family History  Problem Relation Age of Onset   Diabetes Father    Alcohol abuse Father    Diabetes Mother     Social History Social History   Tobacco Use   Smoking status: Former    Types: E-cigarettes    Passive exposure: Past   Smokeless tobacco: Never  Vaping Use   Vaping status: Every Day   Substances: Nicotine, Flavoring  Substance Use Topics   Alcohol use: Yes    Comment: rarely   Drug use: Yes    Types: Marijuana     Allergies   Cat dander and  Ibuprofen   Review of Systems Review of Systems  Genitourinary:  Positive for frequency.     Physical Exam Triage Vital Signs ED Triage Vitals  Encounter Vitals Group     BP 02/13/24 1214 105/70     Systolic BP Percentile --      Diastolic BP Percentile --      Pulse Rate 02/13/24 1214 99     Resp 02/13/24 1214 16     Temp 02/13/24 1214 97.9 F (36.6 C)     Temp Source 02/13/24 1214 Oral     SpO2 02/13/24 1214 99 %     Weight --      Height --      Head Circumference --      Peak Flow --      Pain Score 02/13/24 1212 7     Pain Loc --      Pain Education --      Exclude from Growth Chart --    No data found.  Updated Vital Signs BP 105/70 (BP Location: Right Arm)   Pulse 99   Temp 97.9 F (36.6 C) (Oral)   Resp 16   LMP 01/28/2024 (Exact Date)   SpO2 99%   Visual Acuity Right Eye Distance:   Left Eye Distance:   Bilateral Distance:    Right Eye Near:   Left Eye Near:    Bilateral Near:     Physical Exam Vitals and nursing note reviewed.  Constitutional:      Appearance: She is not ill-appearing.  HENT:     Head: Normocephalic and atraumatic.     Right Ear: Tympanic membrane and ear canal normal.     Left Ear: Tympanic membrane and ear canal normal.     Mouth/Throat:     Mouth: Mucous membranes are moist.  Eyes:     Conjunctiva/sclera: Conjunctivae normal.  Cardiovascular:     Rate and Rhythm: Normal rate and regular rhythm.     Heart sounds: Normal heart sounds.  Pulmonary:     Effort: Pulmonary effort is normal.     Breath sounds: Normal breath sounds.  Abdominal:     General: Bowel sounds are normal.     Palpations: Abdomen is soft.     Tenderness: There is no abdominal tenderness. There is no right CVA tenderness, left CVA tenderness or guarding.  Neurological:     Mental Status: She is alert and oriented to person, place, and time.      UC Treatments / Results  Labs (all labs ordered are listed, but only abnormal results are  displayed) Labs Reviewed  POCT URINALYSIS DIP (MANUAL ENTRY)  POCT URINE PREGNANCY  CERVICOVAGINAL ANCILLARY ONLY    EKG   Radiology No  results found.  Procedures Procedures (including critical care time)  Medications Ordered in UC Medications - No data to display  Initial Impression / Assessment and Plan / UC Course  I have reviewed the triage vital signs and the nursing notes.  Pertinent labs & imaging results that were available during my care of the patient were reviewed by me and considered in my medical decision making (see chart for details).     23 year old female symptoms of UTI for several days, point-of-care urinalysis positive for blood no leuks or nitrates, pregnancy test is negative.  Will treat for hemorrhagic cystitis pending urine culture.  Cytology swab collected and sent. Final Clinical Impressions(s) / UC Diagnoses   Final diagnoses:  Dysuria   Discharge Instructions   None    ED Prescriptions   None    PDMP not reviewed this encounter.   Meliton Rattan, [place] 02/13/24 1240

## 2024-02-13 NOTE — Discharge Instructions (Signed)
 Test results will be released to your MyChart account We will contact you if anything is positive and requires treatment.

## 2024-02-13 NOTE — Telephone Encounter (Signed)
 This RN returned pt's phone call at this time. Name and DOB verified. Pt voiced concerns of her urinalysis results today. This RN went over the urine results with her and states she was diagnosed with cystitis today. The medication, Bactrim, that was called into pharmacy is the treatment for this diagnosis. Pt states she was just needing some clarity on her results and what she needed to pick up at the pharmacy. Pt verbalized understanding. No further questions/concerns at this time.

## 2024-02-14 LAB — CERVICOVAGINAL ANCILLARY ONLY
Bacterial Vaginitis (gardnerella): NEGATIVE
Candida Glabrata: NEGATIVE
Candida Vaginitis: POSITIVE — AB
Chlamydia: NEGATIVE
Comment: NEGATIVE
Comment: NEGATIVE
Comment: NEGATIVE
Comment: NEGATIVE
Comment: NEGATIVE
Comment: NORMAL
Neisseria Gonorrhea: NEGATIVE
Trichomonas: NEGATIVE

## 2024-04-03 ENCOUNTER — Other Ambulatory Visit (HOSPITAL_COMMUNITY)
Admission: RE | Admit: 2024-04-03 | Discharge: 2024-04-03 | Disposition: A | Discharge status: Home / Self Care | Source: Ambulatory Visit | Attending: Family Medicine | Admitting: Family Medicine

## 2024-04-03 ENCOUNTER — Ambulatory Visit (INDEPENDENT_AMBULATORY_CARE_PROVIDER_SITE_OTHER)

## 2024-04-03 VITALS — Wt 113.0 lb

## 2024-04-03 DIAGNOSIS — B379 Candidiasis, unspecified: Secondary | ICD-10-CM | POA: Diagnosis present

## 2024-04-03 DIAGNOSIS — N898 Other specified noninflammatory disorders of vagina: Secondary | ICD-10-CM | POA: Insufficient documentation

## 2024-04-03 DIAGNOSIS — Z113 Encounter for screening for infections with a predominantly sexual mode of transmission: Secondary | ICD-10-CM

## 2024-04-03 MED ORDER — FLUCONAZOLE 150 MG PO TABS: ORAL_TABLET | ORAL | 0 refills | Status: AC

## 2024-04-03 NOTE — Progress Notes (Cosign Needed Addendum)
 SUBJECTIVE:  23 y.o. female complains of white vaginal discharge for 3 week(s). Denies abnormal vaginal bleeding or significant pelvic pain or fever. No UTI symptoms. Denies history of known exposure to STD. Patient also requests STI testing.  Patient's last menstrual period was 03/17/2024 (exact date).  OBJECTIVE:  She appears well, afebrile. Urine dipstick: not done.  ASSESSMENT:  Vaginal Discharge     PLAN:  GC, chlamydia, trichomonas, BVAG, CVAG probe sent to lab. Patient was sent to the lab to have labs drawn. Treatment: To be determined once lab results are received ROV prn if symptoms persist or worsen.   Johany Hansman l Jacen Carlini, CMA

## 2024-04-04 ENCOUNTER — Encounter: Payer: Self-pay | Admitting: Family Medicine

## 2024-04-04 LAB — HIV ANTIBODY (ROUTINE TESTING W REFLEX): HIV Screen 4th Generation wRfx: NONREACTIVE

## 2024-04-04 LAB — RPR: RPR Ser Ql: NONREACTIVE

## 2024-04-04 LAB — HEPATITIS B SURFACE ANTIGEN: Hepatitis B Surface Ag: NEGATIVE

## 2024-04-04 LAB — HEPATITIS C ANTIBODY: Hep C Virus Ab: NONREACTIVE

## 2024-04-08 ENCOUNTER — Telehealth: Payer: Self-pay

## 2024-04-08 NOTE — Telephone Encounter (Signed)
 Patient called requesting vaginal swab results. Patient made aware that the lab is having issues with the BV machine. Patient also requests a Rx for Zofran due to nausea with her periods. A message will be sent to the provider. Understanding was voiced. Jeshua Ransford l Dhanya Bogle, CMA

## 2024-04-09 LAB — CERVICOVAGINAL ANCILLARY ONLY
Bacterial Vaginitis (gardnerella): NEGATIVE
Candida Glabrata: NEGATIVE
Candida Vaginitis: POSITIVE — AB
Chlamydia: NEGATIVE
Comment: NEGATIVE
Comment: NEGATIVE
Comment: NEGATIVE
Comment: NEGATIVE
Comment: NEGATIVE
Comment: NORMAL
Neisseria Gonorrhea: NEGATIVE
Trichomonas: NEGATIVE

## 2024-04-09 MED ORDER — ONDANSETRON 4 MG PO TBDP: 4.0000 mg | ORAL_TABLET | Freq: Three times a day (TID) | ORAL | 3 refills | Status: AC | PRN

## 2024-04-09 NOTE — Telephone Encounter (Signed)
-----   Message from [facility] sent at 04/09/2024  8:06 AM EDT ----- Regarding: FW: Zofran I apologize. Patient is attached now. ----- Message ----- From: Malka Sea, DO Sent: 04/08/2024   2:48 PM EDT To: Dot Gazella, CMA Subject: RE: Zofran                                     No patient attached, but I am willing to do that ----- Message ----- From: Dot Gazella, CMA Sent: 04/08/2024   2:07 PM EDT To: Earlene Bjelland J Magon Croson, DO Subject: Zofran                                         This patient wants to know if you can prescribe Zofran for nausea during her cycles?

## 2024-04-12 ENCOUNTER — Ambulatory Visit (HOSPITAL_COMMUNITY)
Admission: EM | Admit: 2024-04-12 | Discharge: 2024-04-12 | Disposition: A | Discharge status: Home / Self Care | Attending: Psychiatry | Admitting: Psychiatry

## 2024-04-12 DIAGNOSIS — F909 Attention-deficit hyperactivity disorder, unspecified type: Secondary | ICD-10-CM | POA: Insufficient documentation

## 2024-04-12 DIAGNOSIS — F4323 Adjustment disorder with mixed anxiety and depressed mood: Secondary | ICD-10-CM | POA: Insufficient documentation

## 2024-04-12 DIAGNOSIS — F913 Oppositional defiant disorder: Secondary | ICD-10-CM | POA: Insufficient documentation

## 2024-04-12 NOTE — Discharge Instructions (Addendum)
 Discharge recommendations:   Outpatient Follow up: Please review resources for psychiatry and counseling. Please follow up with your primary care provider for all medical related needs.   You are scheduled an appointment with Nadene Aures, Joycie Noble on Tuesday April 16, 2024 at 11 am.   Nadene Aures, MSW, [place], [facility] [address] [place], [place] [address] Office [phone]  You are encouraged to follow up with [facility] for outpatient services for psychiatry and counseling.   Walk in/ Open Access Hours: Monday - Friday 8AM - 11AM (Please arrive at 7:00 Am  [facility] [address] [place], [place] [phone]  Therapy: We recommend that patient participate in individual therapy to address mental health concerns.  Safety:   The following safety precautions should be taken:   No sharp objects. This includes scissors, razors, scrapers, and putty knives.   Chemicals should be removed and locked up.   Medications should be removed and locked up.   Weapons should be removed and locked up. This includes firearms, knives and instruments that can be used to cause injury.   The patient should abstain from use of illicit substances/drugs and abuse of any medications.  If symptoms worsen or do not continue to improve or if the patient becomes actively suicidal or homicidal then it is recommended that the patient return to the closest hospital emergency department, the [facility], or call 911 for further evaluation and treatment. National Suicide Prevention Lifeline 1-800-SUICIDE or (712) 517-8837.  About 988 988 offers 24/7 access to trained crisis counselors who can help people experiencing mental health-related distress. People can call or text 988 or chat 988lifeline.org for themselves or if they are worried about a loved one who may need crisis support.

## 2024-04-12 NOTE — Progress Notes (Signed)
   04/12/24 1311  [facility] Triage Screening (Walk-ins at [facility] only)  How Did You Hear About Us ? Self  What Is the Reason for Your Visit/Call Today? Patient is a 23 year old female that presents this date as a voluntary walk in requesting resources for therapy. Patient denies any SI, HI or AVH. 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 Patient reports that when patrons buy her a drink she feels obligated to, "drink it," as she reports consuming about 10 mixed drinks in the course of the evening about three time a week. Patient also reports sporadic THC use. Patient resides alone and has limited support. Patient was referred by her PCP through [facility] (patient cannot recall providers name) but she said after her last visit she was verbal in reference to her levels of stress and provider suggested her coming here to [facility]. Patient states she was diagnosed with ADHD at age 88 and was briefly on medications (patient is vague in reference to length of intervention and what medication she was prescribed).  Patient reports one prior inpatient admission at age 79 at [facility] for passive SI. Patient states she did not act on any self harm at that time. Patient presents with a pleasant affect and is open to treatment interventions.  How Long Has This Been Causing You Problems? 1 wk - 1 month  Have You Recently Had Any Thoughts About Hurting Yourself? No  Are You Planning to Commit Suicide/Harm Yourself At This time? No  Have you Recently Had Thoughts About Hurting Someone Marigene Shoulder? No  Are You Planning To Harm Someone At This Time? No  Physical Abuse Denies  Verbal Abuse Denies  Sexual Abuse Denies  Exploitation of patient/patient's resources Denies  Self-Neglect Denies  Possible abuse reported to: Other (Comment) (NA)  Are you currently experiencing any auditory, visual or other hallucinations? No  Have You Used Any Alcohol or Drugs in the  Past 24 Hours? No  Do you have any current medical co-morbidities that require immediate attention? No  Clinician description of patient physical appearance/behavior: Patient is pleasant and open to treatment interventions  What Do You Feel Would Help You the Most Today? Treatment for Depression or other mood problem  If access to [facility] was not available, would you have sought care in the Emergency Department? No  Determination of Need Routine (7 days)  Options For Referral Outpatient Therapy

## 2024-04-12 NOTE — ED Notes (Signed)
 Pt discharged and escorted out by provider.

## 2024-04-12 NOTE — Care Management (Signed)
 OBS Care Management   Based on the information you have provided and the presenting issue, outpatient services and resources for have been recommended.  It is imperative that you follow through with treatment recommendations within 5-7 days from the of discharge to mitigate further risk to your safety and mental well-being. A list of referrals has been provided below to get you started.  You are not limited to the list provided.  In case of an urgent crisis, you may contact the Mobile Crisis Unit with [facility] at 1.931-827-6544.  Writer was able to link the patient with Evelyn Muss, LCSW to have an appointment with her VIRTUALLY on Tuesday April 16, 2024 at [facility], MSW, [place], [place] [address] [place], [place] [address] Office [phone] Fax [phone]   I am pleased that you have selected me as your therapist.  I have a Master of Social Work degree, obtained at the [employer] [place] in 2000.  I am licensed with the [place]  [employer] Public Librarian [phone]).   The services offered to you include individual, family, and group counseling.  My therapeutic approach  is derived from my training and experience in Cognitive-Behavioral Therapy, Solution Focused Therapy, Reality Therapy, as well as, interpersonal and developmental theories of counseling.  I have special interests in anxiety, depression, sexual and physical abuse, play therapy, child and adolescent development, and family systems.  I am specifically trained in Trauma Focused-Cognitive Behavioral Therapy (TF-CBT).  I will not discriminate because of age, race, gender, sexual orientation or religion.  If there is anything I should know concerning your culture or religion, I ask that you please inform me so that I can better understand you.  If for any reason, I determine that my knowledge and expertise are not sufficient for your particular  needs, I will make every effort to refer you to another counselor who is prepared to work more effectively with you.  Are you or a loved one dealing with the aftermath of a traumatic experience? Do life transitions leave you grappling with feelings of depression or anxiety? Are you concerned about your child's negative behaviors, sleep disturbances, or difficulties during a divorce? It's important to understand that children primarily operate based on emotions, while adults tend to use rational thinking. This emotional focus can impact a child's ability to make effective decisions and express their feelings.   Services Individual Counseling  Individual counseling is a personal opportunity to receive support and experience growth during challenging times in life. Individual counseling can help one deal with many personal topics in life such as anger, depression, anxiety, substance abuse, marriage and relationship challenges, parenting problems, school difficulties, career changes, etc.  Family Counseling  Families often are faced with issues and challenges that greatly impact all the members. The goal of family therapy is to renew and maintain the natural family structure and support group with an emphasis on returning the individual to a healthy family and community life. Family counseling can help improve communication, resolve conflict, and improve connections. [/one-half-first]    Parenting Coordinator Parent Coordinator is a neutral third party acting in the children's best interest attempting to reach a fair compromise of the issues at hand. Assisting parents manage their parenting plan, improve communication, and resolve disputes. A court appointment neutral party.     Treatment specialization includes: Depression  Anxiety  Attention Deficit/Hyperactive Disorder  Grief Counseling  Conflict Resolution  Emotional Regulation  Family  Counseling  Divorce/Separation  Mediation  Conflict Resolution  Relationship Building  Parenting Coordinator  Parental Support

## 2024-04-12 NOTE — ED Provider Notes (Signed)
 Behavioral Health Urgent Care Medical Screening Exam  Patient Name: Evelyn [name] MRN: [id number] Date of Evaluation: 04/12/24 Chief Complaint:  really stressed Diagnosis:  Final diagnoses:  Adjustment disorder with mixed anxiety and depressed mood    History of Present illness: Evelyn [name] is a 23 y.o. female patient with a documented history significant for MDD, ADHD and ODD who presented to the [facility] health urgent care voluntary unaccompanied with complaints of feeling really stressed.  Patient reports that she has been feeling really stressed. She reports that she does not have any support from her family although they live here and chooses not to help her. She reports that she has to put herself in uncomfortable situations at the age of 23 years old to be able to provide a living. 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 She reports that because of her current situation of not being able to keep up with her bills, rent, and car payments, she is having to moved back in with her mother on May 1 which is causing significant stress. She reports that she does not have a good relationship with her mother because she feels like they have a bargaining relationship and that her mother relies on her to take care of her younger siblings. She also reports that she does not have a good relationship with her father because when she was younger he was an alcoholic and she did not spend a lot of time with him and he continues to blames her for not spending time with him and refuses to have her for that reason.   She reports that since January she has been experiencing mood swings. She describes her symptoms as feeling overwhelmed, over thinking, worried, lonely, sad, irritable, hopeless, isolating and no longer enjoying dancing like she used to. She denies suicidal ideations. She denies self injurious behaviors. She denies homicidal ideations. She denies auditory or  visual hallucinations. There is no objective evidence that the patient is currently responding to internal or external stimuli.  She reports that sometimes when she is stressed or feeling irritable she will drink her sorrows  away. She reports to drinking 3 times per week, mostly on weekends when she is working as a horticulturist, commercial and states that she will probably consume 2 pints of liquor. She reports occasional marijuana use.  She denies outpatient psychiatry or counseling. She is currently interested in getting a therapist  have someone that she can talk to about her problems and mental health. She reports one inpatient psychiatric hospitalization at [facility] when she was 17 years of after a suicide attempt by trying to ingest pills after an altercation with her mother.   She resides alone. She is employed as a horticulturist, commercial. She denies taking prescribed medications. She denies physical complaints.   Plan of care: Patient scheduled an appointment with Olam Muss, LCSW VIRTUALLY on Tuesday April 16, 2024 at 11 am for counseling.   Patient encouraged to follow up with [facility] for outpatient services for psychiatry and counseling during walk in/ open access hour to establish services.   Safety planning completed prior to discharge. Patient provided with an educational handout on adjustment disorder and coping skills.    Flowsheet Row ED from 04/12/2024 in [facility] ED from 02/13/2024 in [facility] at [facility] ED from 12/27/2023 in [facility] Department at [facility]  C-SSRS RISK CATEGORY No Risk No Risk  No Risk       Psychiatric Specialty Exam  Presentation  General Appearance:Appropriate for Environment  Eye Contact:Fair  Speech:Clear and Coherent  Speech Volume:Normal  Handedness:Right   Mood and Affect  Mood:Anxious  Affect:Congruent   Thought Process   Thought Processes:Coherent  Descriptions of Associations:Intact  Orientation:Full (Time, Place and Person)  Thought Content:Logical    Hallucinations:None  Ideas of Reference:None  Suicidal Thoughts:No  Homicidal Thoughts:No   Sensorium  Memory:Immediate Fair; Recent Fair; Remote Fair  Judgment:Fair  Insight:Fair   Executive Functions  Concentration:Fair  Attention Span:Fair  Recall:Fair  Fund of Knowledge:Fair  Language:Fair   Psychomotor Activity  Psychomotor Activity:Normal   Assets  Assets:Communication Skills; Desire for Improvement; Financial Resources/Insurance; Housing; Leisure Time; Physical Health   Sleep  Sleep:Fair  Physical Exam: Physical Exam Cardiovascular:     Rate and Rhythm: Normal rate.  Pulmonary:     Effort: Pulmonary effort is normal.  Musculoskeletal:        General: Normal range of motion.  Neurological:     Mental Status: She is alert and oriented to person, place, and time.    Review of Systems  Constitutional: Negative.   HENT: Negative.    Eyes: Negative.   Respiratory: Negative.    Cardiovascular: Negative.   Gastrointestinal: Negative.   Genitourinary: Negative.   Musculoskeletal: Negative.   Neurological: Negative.   Endo/Heme/Allergies: Negative.    Blood pressure 111/82, pulse 63, temperature 97.8 F (36.6 C), temperature source Oral, resp. rate 17, last menstrual period 03/17/2024, SpO2 100%. There is no height or weight on file to calculate BMI.  Musculoskeletal: Strength & Muscle Tone: within normal limits Gait & Station: normal Patient leans: N/A   [facility] MSE Discharge Disposition for Follow up and Recommendations: Based on my evaluation the patient does not appear to have an emergency medical condition and can be discharged with resources and follow up care in outpatient services for Medication Management and Individual Therapy  Discharge recommendations:   Outpatient Follow up: Please review  resources for psychiatry and counseling. Please follow up with your primary care provider for all medical related needs.   You are scheduled an appointment with Olam Muss, KEN MATAR on Tuesday April 16, 2024 at 11 am.   Olam Muss, MSW, [place], [facility] [address] [place], [place] [address] Office [phone]  You are encouraged to follow up with [facility] for outpatient services for psychiatry and counseling.   Walk in/ Open Access Hours: Monday - Friday 8AM - 11AM (Please arrive at 7:00 Am  [facility] [address] [place], [place] [phone]  Therapy: We recommend that patient participate in individual therapy to address mental health concerns.  Safety:   The following safety precautions should be taken:   No sharp objects. This includes scissors, razors, scrapers, and putty knives.   Chemicals should be removed and locked up.   Medications should be removed and locked up.   Weapons should be removed and locked up. This includes firearms, knives and instruments that can be used to cause injury.   The patient should abstain from use of illicit substances/drugs and abuse of any medications.  If symptoms worsen or do not continue to improve or if the patient becomes actively suicidal or homicidal then it is recommended that the patient return to the closest hospital emergency department, the [facility], or call 911 for further evaluation and treatment. National Suicide Prevention Lifeline 1-800-SUICIDE or  6787093424.  About 988 988 offers 24/7 access to trained crisis counselors who can help people experiencing mental health-related distress. People can call or text 988 or chat 988lifeline.org for themselves or if they are worried about a loved one who may need crisis support.  Jovana Rembold L, NP 04/12/2024, 2:01 PM
